# Patient Record
Sex: Female | Born: 1945 | Race: Black or African American | Hispanic: No | Marital: Married | Smoking: Former smoker
Health system: Southern US, Community
[De-identification: ages and names within clinical notes are randomized; demographics above are authoritative.]

## PROBLEM LIST (undated history)

## (undated) DIAGNOSIS — I10 Essential (primary) hypertension: Secondary | ICD-10-CM

## (undated) DIAGNOSIS — E119 Type 2 diabetes mellitus without complications: Secondary | ICD-10-CM

---

## 2016-01-06 ENCOUNTER — Emergency Department (HOSPITAL_COMMUNITY): Payer: Medicare Other

## 2016-01-06 ENCOUNTER — Encounter (HOSPITAL_COMMUNITY): Payer: Self-pay | Admitting: Emergency Medicine

## 2016-01-06 ENCOUNTER — Emergency Department (HOSPITAL_COMMUNITY)
Admission: EM | Admit: 2016-01-06 | Discharge: 2016-01-06 | Disposition: A | Discharge status: Home / Self Care | Payer: Medicare Other | Attending: Emergency Medicine | Admitting: Emergency Medicine

## 2016-01-06 DIAGNOSIS — K8020 Calculus of gallbladder without cholecystitis without obstruction: Secondary | ICD-10-CM | POA: Diagnosis not present

## 2016-01-06 DIAGNOSIS — E119 Type 2 diabetes mellitus without complications: Secondary | ICD-10-CM | POA: Insufficient documentation

## 2016-01-06 DIAGNOSIS — I10 Essential (primary) hypertension: Secondary | ICD-10-CM | POA: Insufficient documentation

## 2016-01-06 DIAGNOSIS — Z5321 Procedure and treatment not carried out due to patient leaving prior to being seen by health care provider: Secondary | ICD-10-CM | POA: Diagnosis not present

## 2016-01-06 DIAGNOSIS — Z79899 Other long term (current) drug therapy: Secondary | ICD-10-CM | POA: Insufficient documentation

## 2016-01-06 DIAGNOSIS — R1110 Vomiting, unspecified: Secondary | ICD-10-CM | POA: Diagnosis present

## 2016-01-06 HISTORY — DX: Type 2 diabetes mellitus without complications: E11.9

## 2016-01-06 HISTORY — DX: Essential (primary) hypertension: I10

## 2016-01-06 LAB — I-STAT CHEM 8, ED
BUN: 33 mg/dL — ABNORMAL HIGH (ref 6–20)
CALCIUM ION: 1.14 mmol/L (ref 1.13–1.30)
CHLORIDE: 104 mmol/L (ref 101–111)
Creatinine, Ser: 1.4 mg/dL — ABNORMAL HIGH (ref 0.44–1.00)
Glucose, Bld: 120 mg/dL — ABNORMAL HIGH (ref 65–99)
HEMATOCRIT: 51 % — AB (ref 36.0–46.0)
Hemoglobin: 17.3 g/dL — ABNORMAL HIGH (ref 12.0–15.0)
Potassium: 3.2 mmol/L — ABNORMAL LOW (ref 3.5–5.1)
SODIUM: 145 mmol/L (ref 135–145)
TCO2: 28 mmol/L (ref 0–100)

## 2016-01-06 LAB — COMPREHENSIVE METABOLIC PANEL
ALT: 18 U/L (ref 14–54)
ANION GAP: 9 (ref 5–15)
AST: 25 U/L (ref 15–41)
Albumin: 4.4 g/dL (ref 3.5–5.0)
Alkaline Phosphatase: 82 U/L (ref 38–126)
BILIRUBIN TOTAL: 0.5 mg/dL (ref 0.3–1.2)
BUN: 28 mg/dL — AB (ref 6–20)
CHLORIDE: 103 mmol/L (ref 101–111)
CO2: 27 mmol/L (ref 22–32)
Calcium: 9.4 mg/dL (ref 8.9–10.3)
Creatinine, Ser: 1.44 mg/dL — ABNORMAL HIGH (ref 0.44–1.00)
GFR, EST AFRICAN AMERICAN: 42 mL/min — AB (ref >60)
GFR, EST NON AFRICAN AMERICAN: 36 mL/min — AB (ref >60)
Glucose, Bld: 120 mg/dL — ABNORMAL HIGH (ref 65–99)
POTASSIUM: 3.1 mmol/L — AB (ref 3.5–5.1)
Sodium: 139 mmol/L (ref 135–145)
TOTAL PROTEIN: 8.4 g/dL — AB (ref 6.5–8.1)

## 2016-01-06 LAB — URINALYSIS, ROUTINE W REFLEX MICROSCOPIC
Bilirubin Urine: NEGATIVE
Glucose, UA: >1000 mg/dL — AB
Ketones, ur: NEGATIVE mg/dL
NITRITE: NEGATIVE
PROTEIN: NEGATIVE mg/dL
SPECIFIC GRAVITY, URINE: 1.01 (ref 1.005–1.030)
pH: 7 (ref 5.0–8.0)

## 2016-01-06 LAB — CBC WITH DIFFERENTIAL/PLATELET
BASOS ABS: 0 10*3/uL (ref 0.0–0.1)
Basophils Relative: 0 %
EOS PCT: 2 %
Eosinophils Absolute: 0.2 10*3/uL (ref 0.0–0.7)
HEMATOCRIT: 44.1 % (ref 36.0–46.0)
Hemoglobin: 14.9 g/dL (ref 12.0–15.0)
LYMPHS PCT: 38 %
Lymphs Abs: 3.6 10*3/uL (ref 0.7–4.0)
MCH: 31.8 pg (ref 26.0–34.0)
MCHC: 33.8 g/dL (ref 30.0–36.0)
MCV: 94.2 fL (ref 78.0–100.0)
MONO ABS: 0.6 10*3/uL (ref 0.1–1.0)
MONOS PCT: 6 %
NEUTROS ABS: 5.1 10*3/uL (ref 1.7–7.7)
Neutrophils Relative %: 54 %
PLATELETS: 201 10*3/uL (ref 150–400)
RBC: 4.68 MIL/uL (ref 3.87–5.11)
RDW: 13.3 % (ref 11.5–15.5)
WBC: 9.5 10*3/uL (ref 4.0–10.5)

## 2016-01-06 LAB — URINE MICROSCOPIC-ADD ON

## 2016-01-06 LAB — LIPASE, BLOOD: LIPASE: 40 U/L (ref 11–51)

## 2016-01-06 LAB — I-STAT TROPONIN, ED: TROPONIN I, POC: 0 ng/mL (ref 0.00–0.08)

## 2016-01-06 MED ORDER — ONDANSETRON HCL 4 MG/2ML IJ SOLN
4.0000 mg | Freq: Once | INTRAMUSCULAR | Status: AC
  Administered 2016-01-06: 4 mg via INTRAVENOUS
  Filled 2016-01-06: qty 2

## 2016-01-06 MED ORDER — FENTANYL CITRATE (PF) 100 MCG/2ML IJ SOLN
50.0000 ug | Freq: Once | INTRAMUSCULAR | Status: AC
  Administered 2016-01-06: 50 ug via INTRAVENOUS
  Filled 2016-01-06: qty 2

## 2016-01-06 MED ORDER — FENTANYL CITRATE (PF) 100 MCG/2ML IJ SOLN
25.0000 ug | Freq: Once | INTRAMUSCULAR | Status: AC
  Administered 2016-01-06: 25 ug via INTRAVENOUS
  Filled 2016-01-06: qty 2

## 2016-01-06 MED ORDER — ONDANSETRON HCL 8 MG PO TABS: 8.0000 mg | ORAL_TABLET | ORAL | Status: AC | PRN

## 2016-01-06 MED ORDER — OXYCODONE-ACETAMINOPHEN 5-325 MG PO TABS: 1.0000 | ORAL_TABLET | ORAL | Status: DC | PRN

## 2016-01-06 NOTE — ED Notes (Signed)
Pt states understanding of care given and follow up instructions.  Provided pt with copies of CT and labs because pts Dr is in [place].  Pt ambulated from ED with family.

## 2016-01-06 NOTE — ED Provider Notes (Signed)
CT scan reviewed with the patient and her daughter. Patient has a 12 mm stone in the gallbladder, but no evidence of cholecystitis. White count is normal. She will get an ultrasound in her hometown [place]. Prescriptions for pain and nausea medicine. No acute abdomen at discharge  Angel HutchingBrian Arta Stump, MD 01/06/16 2307

## 2016-01-06 NOTE — Discharge Instructions (Signed)
Cholelithiasis Cholelithiasis (also called gallstones) is a form of gallbladder disease in which gallstones form in your gallbladder. The gallbladder is an organ that stores bile made in the liver, which helps digest fats. Gallstones begin as small crystals and slowly grow into stones. Gallstone pain occurs when the gallbladder spasms and a gallstone is blocking the duct. Pain can also occur when a stone passes out of the duct.  RISK FACTORS  Being female.   Having multiple pregnancies. Health care providers sometimes advise removing diseased gallbladders before future pregnancies.   Being obese.  Eating a diet heavy in fried foods and fat.   Being older than 60 years and increasing age.   Prolonged use of medicines containing female hormones.   Having diabetes mellitus.   Rapidly losing weight.   Having a family history of gallstones (heredity).  SYMPTOMS  Nausea.   Vomiting.  Abdominal pain.   Yellowing of the skin (jaundice).   Sudden pain. It may persist from several minutes to several hours.  Fever.   Tenderness to the touch. In some cases, when gallstones do not move into the bile duct, people have no pain or symptoms. These are called "silent" gallstones.  TREATMENT Silent gallstones do not need treatment. In severe cases, emergency surgery may be required. Options for treatment include:  Surgery to remove the gallbladder. This is the most common treatment.  Medicines. These do not always work and may take 6-12 months or more to work.  Shock wave treatment (extracorporeal biliary lithotripsy). In this treatment an ultrasound machine sends shock waves to the gallbladder to break gallstones into smaller pieces that can pass into the intestines or be dissolved by medicine. HOME CARE INSTRUCTIONS   Only take over-the-counter or prescription medicines for pain, discomfort, or fever as directed by your health care provider.   Follow a low-fat diet until  seen again by your health care provider. Fat causes the gallbladder to contract, which can result in pain.   Follow up with your health care provider as directed. Attacks are almost always recurrent and surgery is usually required for permanent treatment.  SEEK IMMEDIATE MEDICAL CARE IF:   Your pain increases and is not controlled by medicines.   You have a fever or persistent symptoms for more than 2-3 days.   You have a fever and your symptoms suddenly get worse.   You have persistent nausea and vomiting.  MAKE SURE YOU:   Understand these instructions.  Will watch your condition.  Will get help right away if you are not doing well or get worse.   This information is not intended to replace advice given to you by your health care provider. Make sure you discuss any questions you have with your health care provider.   Document Released: 07/31/2005 Document Revised: 04/06/2013 Document Reviewed: 01/26/2013 Elsevier Interactive Patient Education [employer]2016 Elsevier Inc.   You have a large gallstone in your gallbladder. You will need an ultrasound. This can be done by your doctor back home. Medication for pain and nausea. Clear liquids for the next day. Take copies of test results with you.

## 2016-01-06 NOTE — ED Notes (Signed)
Pt states she had sudden onset of low back pain, central in nature, with nausea and vomiting.  Began about an hour pta.

## 2016-01-10 ENCOUNTER — Inpatient Hospital Stay (HOSPITAL_COMMUNITY)
Admission: AD | Admit: 2016-01-10 | Discharge: 2016-01-19 | DRG: 418 | Disposition: A | Discharge status: Home / Self Care | Payer: Medicare Other | Source: Ambulatory Visit | Attending: Internal Medicine | Admitting: Internal Medicine

## 2016-01-10 ENCOUNTER — Encounter (HOSPITAL_COMMUNITY): Payer: Self-pay | Admitting: Family Medicine

## 2016-01-10 ENCOUNTER — Other Ambulatory Visit: Payer: Self-pay

## 2016-01-10 DIAGNOSIS — N179 Acute kidney failure, unspecified: Secondary | ICD-10-CM | POA: Diagnosis present

## 2016-01-10 DIAGNOSIS — E669 Obesity, unspecified: Secondary | ICD-10-CM | POA: Diagnosis present

## 2016-01-10 DIAGNOSIS — J90 Pleural effusion, not elsewhere classified: Secondary | ICD-10-CM | POA: Diagnosis not present

## 2016-01-10 DIAGNOSIS — J9811 Atelectasis: Secondary | ICD-10-CM | POA: Diagnosis not present

## 2016-01-10 DIAGNOSIS — M545 Low back pain: Secondary | ICD-10-CM | POA: Diagnosis not present

## 2016-01-10 DIAGNOSIS — K8000 Calculus of gallbladder with acute cholecystitis without obstruction: Principal | ICD-10-CM | POA: Diagnosis present

## 2016-01-10 DIAGNOSIS — R188 Other ascites: Secondary | ICD-10-CM | POA: Diagnosis present

## 2016-01-10 DIAGNOSIS — K810 Acute cholecystitis: Secondary | ICD-10-CM | POA: Diagnosis present

## 2016-01-10 DIAGNOSIS — E785 Hyperlipidemia, unspecified: Secondary | ICD-10-CM | POA: Diagnosis present

## 2016-01-10 DIAGNOSIS — R509 Fever, unspecified: Secondary | ICD-10-CM

## 2016-01-10 DIAGNOSIS — K8050 Calculus of bile duct without cholangitis or cholecystitis without obstruction: Secondary | ICD-10-CM | POA: Diagnosis present

## 2016-01-10 DIAGNOSIS — K8020 Calculus of gallbladder without cholecystitis without obstruction: Secondary | ICD-10-CM | POA: Insufficient documentation

## 2016-01-10 DIAGNOSIS — G8929 Other chronic pain: Secondary | ICD-10-CM | POA: Diagnosis present

## 2016-01-10 DIAGNOSIS — I248 Other forms of acute ischemic heart disease: Secondary | ICD-10-CM | POA: Diagnosis present

## 2016-01-10 DIAGNOSIS — Z87891 Personal history of nicotine dependence: Secondary | ICD-10-CM

## 2016-01-10 DIAGNOSIS — K8010 Calculus of gallbladder with chronic cholecystitis without obstruction: Secondary | ICD-10-CM | POA: Diagnosis not present

## 2016-01-10 DIAGNOSIS — E119 Type 2 diabetes mellitus without complications: Secondary | ICD-10-CM | POA: Diagnosis present

## 2016-01-10 DIAGNOSIS — K829 Disease of gallbladder, unspecified: Secondary | ICD-10-CM

## 2016-01-10 DIAGNOSIS — E876 Hypokalemia: Secondary | ICD-10-CM | POA: Diagnosis present

## 2016-01-10 DIAGNOSIS — Z79899 Other long term (current) drug therapy: Secondary | ICD-10-CM

## 2016-01-10 DIAGNOSIS — I10 Essential (primary) hypertension: Secondary | ICD-10-CM | POA: Diagnosis present

## 2016-01-10 DIAGNOSIS — B9620 Unspecified Escherichia coli [E. coli] as the cause of diseases classified elsewhere: Secondary | ICD-10-CM | POA: Diagnosis present

## 2016-01-10 DIAGNOSIS — Z880 Allergy status to penicillin: Secondary | ICD-10-CM

## 2016-01-10 DIAGNOSIS — Z7984 Long term (current) use of oral hypoglycemic drugs: Secondary | ICD-10-CM

## 2016-01-10 DIAGNOSIS — Z6829 Body mass index (BMI) 29.0-29.9, adult: Secondary | ICD-10-CM

## 2016-01-10 LAB — CBC WITH DIFFERENTIAL/PLATELET
BASOS PCT: 0 %
Basophils Absolute: 0 10*3/uL (ref 0.0–0.1)
EOS ABS: 0 10*3/uL (ref 0.0–0.7)
EOS PCT: 0 %
HCT: 38.7 % (ref 36.0–46.0)
HEMOGLOBIN: 12.9 g/dL (ref 12.0–15.0)
Lymphocytes Relative: 12 %
Lymphs Abs: 1.9 10*3/uL (ref 0.7–4.0)
MCH: 30.9 pg (ref 26.0–34.0)
MCHC: 33.3 g/dL (ref 30.0–36.0)
MCV: 92.6 fL (ref 78.0–100.0)
Monocytes Absolute: 2.1 10*3/uL — ABNORMAL HIGH (ref 0.1–1.0)
Monocytes Relative: 13 %
NEUTROS PCT: 75 %
Neutro Abs: 11.7 10*3/uL — ABNORMAL HIGH (ref 1.7–7.7)
PLATELETS: 186 10*3/uL (ref 150–400)
RBC: 4.18 MIL/uL (ref 3.87–5.11)
RDW: 13 % (ref 11.5–15.5)
WBC: 15.7 10*3/uL — AB (ref 4.0–10.5)

## 2016-01-10 LAB — PROCALCITONIN: PROCALCITONIN: 3.58 ng/mL

## 2016-01-10 LAB — LACTIC ACID, PLASMA: LACTIC ACID, VENOUS: 1.1 mmol/L (ref 0.5–2.0)

## 2016-01-10 LAB — COMPREHENSIVE METABOLIC PANEL
ALK PHOS: 86 U/L (ref 38–126)
ALT: 27 U/L (ref 14–54)
ANION GAP: 8 (ref 5–15)
AST: 33 U/L (ref 15–41)
Albumin: 2.6 g/dL — ABNORMAL LOW (ref 3.5–5.0)
BILIRUBIN TOTAL: 0.7 mg/dL (ref 0.3–1.2)
BUN: 39 mg/dL — ABNORMAL HIGH (ref 6–20)
CALCIUM: 8.7 mg/dL — AB (ref 8.9–10.3)
CO2: 28 mmol/L (ref 22–32)
Chloride: 98 mmol/L — ABNORMAL LOW (ref 101–111)
Creatinine, Ser: 2.3 mg/dL — ABNORMAL HIGH (ref 0.44–1.00)
GFR, EST AFRICAN AMERICAN: 24 mL/min — AB (ref >60)
GFR, EST NON AFRICAN AMERICAN: 20 mL/min — AB (ref >60)
Glucose, Bld: 130 mg/dL — ABNORMAL HIGH (ref 65–99)
Potassium: 2.8 mmol/L — ABNORMAL LOW (ref 3.5–5.1)
Sodium: 134 mmol/L — ABNORMAL LOW (ref 135–145)
TOTAL PROTEIN: 6.8 g/dL (ref 6.5–8.1)

## 2016-01-10 LAB — AMYLASE: Amylase: 46 U/L (ref 28–100)

## 2016-01-10 LAB — PROTIME-INR
INR: 1.22 (ref 0.00–1.49)
PROTHROMBIN TIME: 15.6 s — AB (ref 11.6–15.2)

## 2016-01-10 LAB — GLUCOSE, CAPILLARY: GLUCOSE-CAPILLARY: 102 mg/dL — AB (ref 65–99)

## 2016-01-10 LAB — LIPASE, BLOOD: Lipase: 20 U/L (ref 11–51)

## 2016-01-10 LAB — MAGNESIUM: Magnesium: 2.1 mg/dL (ref 1.7–2.4)

## 2016-01-10 LAB — APTT: APTT: 31 s (ref 24–37)

## 2016-01-10 LAB — TROPONIN I: Troponin I: 0.11 ng/mL — ABNORMAL HIGH (ref <0.031)

## 2016-01-10 MED ORDER — BISACODYL 5 MG PO TBEC: 5.0000 mg | DELAYED_RELEASE_TABLET | Freq: Every day | ORAL | Status: DC | PRN

## 2016-01-10 MED ORDER — SODIUM CHLORIDE 0.9% FLUSH: 3.0000 mL | INTRAVENOUS | Status: DC | PRN

## 2016-01-10 MED ORDER — HYDROMORPHONE HCL 1 MG/ML IJ SOLN
1.0000 mg | INTRAMUSCULAR | Status: DC | PRN
  Administered 2016-01-12 – 2016-01-13 (×4): 1 mg via INTRAVENOUS
  Filled 2016-01-10 (×5): qty 1

## 2016-01-10 MED ORDER — ACETAMINOPHEN 325 MG PO TABS: 650.0000 mg | ORAL_TABLET | Freq: Four times a day (QID) | ORAL | Status: DC | PRN

## 2016-01-10 MED ORDER — HEPARIN SODIUM (PORCINE) 5000 UNIT/ML IJ SOLN
5000.0000 [IU] | Freq: Three times a day (TID) | INTRAMUSCULAR | Status: DC
  Administered 2016-01-11 (×2): 5000 [IU] via SUBCUTANEOUS
  Filled 2016-01-10 (×3): qty 1

## 2016-01-10 MED ORDER — PRAVASTATIN SODIUM 10 MG PO TABS
20.0000 mg | ORAL_TABLET | Freq: Every day | ORAL | Status: DC
  Administered 2016-01-11 – 2016-01-18 (×8): 20 mg via ORAL
  Filled 2016-01-10 (×8): qty 2

## 2016-01-10 MED ORDER — DEXTROSE 5 % IV SOLN
2.0000 g | INTRAVENOUS | Status: DC
  Administered 2016-01-11 – 2016-01-13 (×4): 2 g via INTRAVENOUS
  Filled 2016-01-10 (×4): qty 2

## 2016-01-10 MED ORDER — INSULIN ASPART 100 UNIT/ML ~~LOC~~ SOLN
0.0000 [IU] | Freq: Three times a day (TID) | SUBCUTANEOUS | Status: DC
  Administered 2016-01-12: 2 [IU] via SUBCUTANEOUS
  Administered 2016-01-13: 1 [IU] via SUBCUTANEOUS
  Administered 2016-01-14: 2 [IU] via SUBCUTANEOUS
  Administered 2016-01-15 – 2016-01-16 (×2): 1 [IU] via SUBCUTANEOUS
  Administered 2016-01-16 – 2016-01-18 (×2): 2 [IU] via SUBCUTANEOUS

## 2016-01-10 MED ORDER — ONDANSETRON HCL 4 MG PO TABS: 4.0000 mg | ORAL_TABLET | Freq: Four times a day (QID) | ORAL | Status: DC | PRN

## 2016-01-10 MED ORDER — ONDANSETRON HCL 4 MG/2ML IJ SOLN
4.0000 mg | Freq: Four times a day (QID) | INTRAMUSCULAR | Status: DC | PRN
  Administered 2016-01-12 – 2016-01-17 (×2): 4 mg via INTRAVENOUS
  Filled 2016-01-10: qty 2

## 2016-01-10 MED ORDER — SODIUM CHLORIDE 0.9 % IV SOLN: 250.0000 mL | INTRAVENOUS | Status: DC | PRN

## 2016-01-10 MED ORDER — SODIUM CHLORIDE 0.9% FLUSH: 3.0000 mL | Freq: Two times a day (BID) | INTRAVENOUS | Status: DC

## 2016-01-10 MED ORDER — POLYETHYLENE GLYCOL 3350 17 G PO PACK: 17.0000 g | PACK | Freq: Every day | ORAL | Status: DC | PRN

## 2016-01-10 MED ORDER — ACETAMINOPHEN 650 MG RE SUPP: 650.0000 mg | Freq: Four times a day (QID) | RECTAL | Status: DC | PRN

## 2016-01-10 MED ORDER — HYDROCODONE-ACETAMINOPHEN 5-325 MG PO TABS
1.0000 | ORAL_TABLET | ORAL | Status: DC | PRN
  Administered 2016-01-13: 1 via ORAL
  Administered 2016-01-13 – 2016-01-15 (×4): 2 via ORAL
  Administered 2016-01-16: 1 via ORAL
  Filled 2016-01-10: qty 2
  Filled 2016-01-10: qty 1
  Filled 2016-01-10 (×3): qty 2
  Filled 2016-01-10: qty 1

## 2016-01-10 NOTE — Progress Notes (Signed)
Pharmacy Antibiotic Note  Angel Gonzalez is a 70 y.o. female admitted on 01/10/2016 with acute chole.  Pharmacy has been consulted for ceftriaxone dosing. Pt presents with sudden onset lower back pain and N/V. Pt has allergy to PCNs in the past with hives but no swelling of throat or tongue or SOB. Pt is willing to try cephalosporins.   Plan: Ceftriaxone 2g IV q24h Monitor culture data, clinical course and LOT  Height: 5\' 3"  (160 cm) Weight: 164 lb (74.39 kg) IBW/kg (Calculated) : 52.4  Temp (24hrs), Avg:99.3 F (37.4 C), Min:98.6 F (37 C), Max:99.9 F (37.7 C)   Recent Labs Lab 01/06/16 1815 01/06/16 1854 01/10/16 2219  WBC 9.5  --  15.7*  CREATININE 1.44* 1.40*  --   LATICACIDVEN  --   --  1.1    Estimated Creatinine Clearance: 36.1 mL/min (by C-G formula based on Cr of 1.4).    Allergies  Allergen Reactions  . Penicillins Hives    Antimicrobials this admission: CTX 5/25 >>   Dose adjustments this admission: n/a  Microbiology results:  BCx:   UCx:    Sputum:    MRSA PCR:    Arlean Hoppingorey M. Newman PiesBall, PharmD, BCPS Clinical Pharmacist Pager [phone] 01/10/2016 11:10 PM

## 2016-01-10 NOTE — H&P (Signed)
History and Physical    Angel Gonzalez WUJ:[id number] DOB: 1946-05-11 DOA: 01/10/2016  [facility]   Patient coming from: Home   Chief Complaint: RUQ abdominal pain   HPI: Angel Gonzalez is a 70 y.o. female with medical history significant for hypertension, hyperlipidemia, type 2 diabetes mellitus, and chronic low back pain who presents as a direct admission, sent by Judee ClaraJulie Johnson, NP for evaluation of biliary colic. Patient reports being in her usual state of health until 01/06/2016 when there was insidious development of a dull ache in the right upper quadrant. This pain progressed over the course of approximately 30 minutes and became accompanied by nausea and vomiting before spontaneously resolving. Since that time, she's had recurrences in this pain with increasing frequency.She now reports a constant, dull ache at the right upper quadrant that is tolerable while at rest, but worse while moving around. She denies noticing any change in her symptoms with oral intake but notes that she has not been up to hold much down due to the nausea and vomiting. She was evaluated for these complaints in the [facility] emergency department on 01/06/2016 with CT scan that demonstrated a 12 mm stone at the gallbladder neck, but per the provider notes, there was no evidence of acute cholecystitis and she was discharged home to follow-up with her primary care provider for abdominal [place]. [name], [name], being told she would need cholecystectomy, and was advised to present to [facility] for direct admission.   Patient is [facility] where she is found to be afebrile, saturating well on room air, and with vital signs stable. She denies any pain currently, noting that she had some earlier this morning. She is in no apparent distress, nontoxic in appearance.  She denies any pain currently, denies any recent chest pain, palpitations, dyspnea, or cough. There is been non-bloody vomiting, but no diarrhea. She denies any urinary symptoms and denies recent antibiotic use. She'll be placed in observation status for further evaluation and management of suspected biliary colic.  Review of Systems:  All other systems reviewed and apart from HPI, are negative.  Past Medical History  Diagnosis Date  . Hypertension   . Diabetes mellitus without complication (HCC)     History reviewed. No pertinent past surgical history.   reports that she has never smoked. She does not have any smokeless tobacco history on file. She reports that she does not drink alcohol or use illicit drugs.  Allergies  Allergen Reactions  . Penicillins Hives    Family History  Problem Relation Age of Onset  . Diabetes type II Other   . Hypertension Other      Prior to Admission medications   Medication Sig Start Date End Date Taking? Authorizing Provider  allopurinol (ZYLOPRIM) 300 MG tablet Take 300 mg by mouth daily.   Yes Historical Provider, MD  amLODipine (NORVASC) 5 MG tablet Take 5 mg by mouth daily.   Yes Historical Provider, MD  canagliflozin (INVOKANA) 100 MG TABS tablet Take 100 mg by mouth daily before breakfast.   Yes Historical Provider, MD  cholecalciferol (VITAMIN D) 1000 units tablet Take 1,000 Units by mouth daily.   Yes Historical Provider, MD  losartan (COZAAR) 100 MG tablet Take 100 mg by mouth daily.   Yes Historical Provider, MD  metoprolol succinate (TOPROL-XL) 25 MG 24 hr tablet  Take 25 mg by mouth daily.   Yes Historical Provider, MD  ondansetron (ZOFRAN) 8 MG tablet Take 1 tablet (8 mg total) by mouth every 4 (four) hours as needed. 01/06/16  Yes Donnetta Hutching, MD  oxyCODONE-acetaminophen (PERCOCET) 5-325 MG tablet Take 1-2 tablets by mouth every 4 (four) hours as needed. 01/06/16  Yes Donnetta Hutching, MD  pravastatin (PRAVACHOL) 20 MG tablet Take 20 mg by  mouth daily.   Yes Historical Provider, MD  FIBER SELECT GUMMIES PO Take 1 tablet by mouth daily as needed (Constipation).    Historical Provider, MD    Physical Exam: Filed Vitals:   01/10/16 1905 01/10/16 2145  BP: 105/58 104/66  Pulse: 97 85  Temp: 99.9 F (37.7 C) 98.6 F (37 C)  TempSrc: Oral Oral  Resp:  18  Height: 5\' 3"  (1.6 m)   Weight: 74.39 kg (164 lb)   SpO2: 99% 95%      Constitutional: NAD, calm, comfortable Eyes: PERTLA, lids and conjunctivae normal ENMT: Mucous membranes are moist. Posterior pharynx clear of any exudate or lesions.   Neck: normal, supple, no masses, no thyromegaly Respiratory: clear to auscultation bilaterally, no wheezing, no crackles. Normal respiratory effort. No accessory muscle use.  Cardiovascular: S1 & S2 heard, regular rate and rhythm, no significant murmurs / rubs / gallops. No significant JVD. Abdomen: No distension, mild tenderness at RUQ, no rebound pain or guarding, no masses palpated. Bowel sounds normal.  Musculoskeletal: no clubbing / cyanosis. No joint deformity upper and lower extremities. Normal muscle tone.  Skin: no significant rashes, lesions, ulcers. Warm, dry, well-perfused. Neurologic: CN 2-12 grossly intact. Sensation intact, DTR normal. Strength 5/5 in all 4 limbs.  Psychiatric: Normal judgment and insight. Alert and oriented x 3. Normal mood and affect.     Labs on Admission: I have personally reviewed following labs and imaging studies  CBC:  Recent Labs Lab 01/06/16 1815 01/06/16 1854  WBC 9.5  --   NEUTROABS 5.1  --   HGB 14.9 17.3*  HCT 44.1 51.0*  MCV 94.2  --   PLT 201  --    Basic Metabolic Panel:  Recent Labs Lab 01/06/16 1815 01/06/16 1854  NA 139 145  K 3.1* 3.2*  CL 103 104  CO2 27  --   GLUCOSE 120* 120*  BUN 28* 33*  CREATININE 1.44* 1.40*  CALCIUM 9.4  --    GFR: Estimated Creatinine Clearance: 36.1 mL/min (by C-G formula based on Cr of 1.4). Liver Function Tests:  Recent  Labs Lab 01/06/16 1815  AST 25  ALT 18  ALKPHOS 82  BILITOT 0.5  PROT 8.4*  ALBUMIN 4.4    Recent Labs Lab 01/06/16 1815  LIPASE 40   No results for input(s): AMMONIA in the last 168 hours. Coagulation Profile: No results for input(s): INR, PROTIME in the last 168 hours. Cardiac Enzymes: No results for input(s): CKTOTAL, CKMB, CKMBINDEX, TROPONINI in the last 168 hours. BNP (last 3 results) No results for input(s): PROBNP in the last 8760 hours. HbA1C: No results for input(s): HGBA1C in the last 72 hours. CBG: No results for input(s): GLUCAP in the last 168 hours. Lipid Profile: No results for input(s): CHOL, HDL, LDLCALC, TRIG, CHOLHDL, LDLDIRECT in the last 72 hours. Thyroid Function Tests: No results for input(s): TSH, T4TOTAL, FREET4, T3FREE, THYROIDAB in the last 72 hours. Anemia Panel: No results for input(s): VITAMINB12, FOLATE, FERRITIN, TIBC, IRON, RETICCTPCT in the last 72 hours. Urine analysis:    Component Value  Date/Time   COLORURINE YELLOW 01/06/2016 1908   APPEARANCEUR CLEAR 01/06/2016 1908   LABSPEC 1.010 01/06/2016 1908   PHURINE 7.0 01/06/2016 1908   GLUCOSEU >1000* 01/06/2016 1908   HGBUR TRACE* 01/06/2016 1908   BILIRUBINUR NEGATIVE 01/06/2016 1908   KETONESUR NEGATIVE 01/06/2016 1908   PROTEINUR NEGATIVE 01/06/2016 1908   NITRITE NEGATIVE 01/06/2016 1908   LEUKOCYTESUR TRACE* 01/06/2016 1908   Sepsis Labs: (procalcitonin:4,lacticidven:4) )No results found for this or any previous visit (from the past 240 hour(s)).   Radiological Exams on Admission: No results found.  EKG: Ordered and pending.   Assessment/Plan  1. Biliary colic  - CT from 5/21 reviewed and notable for a 12 mm stone at the gallbladder neck; there was no evidence for acute cholecystitis at that time  - Pt remains afebrile, non-toxic in appearance  - Abdominal US ordered for further characterization  - Check CMP, CBC w/ differential, lipase, lactate, PCT  -  Clear liquids for now, NPO after MN pending the above workup  - Surgical consultation may be requested pending the above workup   2. Hypertension  - At goal currently, but on the low side  - Managed with losartan, Norvasc, and metoprolol at home  - Holding the home agents for now; plan to resume Norvasc and metoprolol as needed; would hold losartan if surgery may be necessary    3. Type II DM  - No A1c on file  - Managed with Invokana at home, holding while in hospital  - Check CBG with meals and qHS  - Low-intensity SSI prn    4. Hyperlipidemia  - Managed with pravastatin 20 mg qHS at home, will continue    5. 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

## 2016-01-11 ENCOUNTER — Observation Stay (HOSPITAL_BASED_OUTPATIENT_CLINIC_OR_DEPARTMENT_OTHER): Payer: Medicare Other

## 2016-01-11 ENCOUNTER — Observation Stay (HOSPITAL_COMMUNITY): Payer: Medicare Other

## 2016-01-11 ENCOUNTER — Encounter (HOSPITAL_COMMUNITY): Payer: Self-pay | Admitting: General Surgery

## 2016-01-11 DIAGNOSIS — R7989 Other specified abnormal findings of blood chemistry: Secondary | ICD-10-CM | POA: Diagnosis not present

## 2016-01-11 DIAGNOSIS — E876 Hypokalemia: Secondary | ICD-10-CM | POA: Diagnosis present

## 2016-01-11 DIAGNOSIS — K8050 Calculus of bile duct without cholangitis or cholecystitis without obstruction: Secondary | ICD-10-CM | POA: Diagnosis not present

## 2016-01-11 DIAGNOSIS — I10 Essential (primary) hypertension: Secondary | ICD-10-CM | POA: Diagnosis not present

## 2016-01-11 DIAGNOSIS — N179 Acute kidney failure, unspecified: Secondary | ICD-10-CM | POA: Diagnosis present

## 2016-01-11 DIAGNOSIS — K8000 Calculus of gallbladder with acute cholecystitis without obstruction: Principal | ICD-10-CM

## 2016-01-11 LAB — SURGICAL PCR SCREEN
MRSA, PCR: NEGATIVE
STAPHYLOCOCCUS AUREUS: NEGATIVE

## 2016-01-11 LAB — URINALYSIS, ROUTINE W REFLEX MICROSCOPIC
Bilirubin Urine: NEGATIVE
Hgb urine dipstick: NEGATIVE
Ketones, ur: NEGATIVE mg/dL
LEUKOCYTES UA: NEGATIVE
Nitrite: NEGATIVE
PROTEIN: NEGATIVE mg/dL
Specific Gravity, Urine: 1.026 (ref 1.005–1.030)
pH: 5.5 (ref 5.0–8.0)

## 2016-01-11 LAB — BASIC METABOLIC PANEL
Anion gap: 10 (ref 5–15)
BUN: 32 mg/dL — AB (ref 6–20)
CALCIUM: 8.7 mg/dL — AB (ref 8.9–10.3)
CO2: 25 mmol/L (ref 22–32)
CREATININE: 1.98 mg/dL — AB (ref 0.44–1.00)
Chloride: 102 mmol/L (ref 101–111)
GFR, EST AFRICAN AMERICAN: 28 mL/min — AB (ref >60)
GFR, EST NON AFRICAN AMERICAN: 24 mL/min — AB (ref >60)
Glucose, Bld: 134 mg/dL — ABNORMAL HIGH (ref 65–99)
Potassium: 3.6 mmol/L (ref 3.5–5.1)
SODIUM: 137 mmol/L (ref 135–145)

## 2016-01-11 LAB — ECHOCARDIOGRAM COMPLETE
Height: 63 in
WEIGHTICAEL: 2624 [oz_av]

## 2016-01-11 LAB — BLOOD CULTURE ID PANEL (REFLEXED)
Acinetobacter baumannii: NOT DETECTED
CANDIDA ALBICANS: NOT DETECTED
CANDIDA KRUSEI: NOT DETECTED
CANDIDA PARAPSILOSIS: NOT DETECTED
CANDIDA TROPICALIS: NOT DETECTED
CARBAPENEM RESISTANCE: NOT DETECTED
Candida glabrata: NOT DETECTED
ENTEROBACTERIACEAE SPECIES: DETECTED — AB
ENTEROCOCCUS SPECIES: NOT DETECTED
Enterobacter cloacae complex: NOT DETECTED
Escherichia coli: DETECTED — AB
Haemophilus influenzae: NOT DETECTED
KLEBSIELLA OXYTOCA: NOT DETECTED
KLEBSIELLA PNEUMONIAE: NOT DETECTED
Listeria monocytogenes: NOT DETECTED
Methicillin resistance: NOT DETECTED
Neisseria meningitidis: NOT DETECTED
PROTEUS SPECIES: NOT DETECTED
PSEUDOMONAS AERUGINOSA: NOT DETECTED
STAPHYLOCOCCUS SPECIES: NOT DETECTED
STREPTOCOCCUS AGALACTIAE: NOT DETECTED
STREPTOCOCCUS PNEUMONIAE: NOT DETECTED
Serratia marcescens: NOT DETECTED
Staphylococcus aureus (BCID): NOT DETECTED
Streptococcus pyogenes: NOT DETECTED
Streptococcus species: NOT DETECTED
Vancomycin resistance: NOT DETECTED

## 2016-01-11 LAB — URINE MICROSCOPIC-ADD ON
RBC / HPF: NONE SEEN RBC/hpf (ref 0–5)
WBC UA: NONE SEEN WBC/hpf (ref 0–5)

## 2016-01-11 LAB — GLUCOSE, CAPILLARY
GLUCOSE-CAPILLARY: 128 mg/dL — AB (ref 65–99)
GLUCOSE-CAPILLARY: 127 mg/dL — AB (ref 65–99)
Glucose-Capillary: 104 mg/dL — ABNORMAL HIGH (ref 65–99)
Glucose-Capillary: 106 mg/dL — ABNORMAL HIGH (ref 65–99)

## 2016-01-11 LAB — HEPATIC FUNCTION PANEL
ALT: 42 U/L (ref 14–54)
AST: 56 U/L — ABNORMAL HIGH (ref 15–41)
Albumin: 2.5 g/dL — ABNORMAL LOW (ref 3.5–5.0)
Alkaline Phosphatase: 103 U/L (ref 38–126)
BILIRUBIN DIRECT: 0.3 mg/dL (ref 0.1–0.5)
BILIRUBIN INDIRECT: 0.5 mg/dL (ref 0.3–0.9)
TOTAL PROTEIN: 6.8 g/dL (ref 6.5–8.1)
Total Bilirubin: 0.8 mg/dL (ref 0.3–1.2)

## 2016-01-11 LAB — LACTIC ACID, PLASMA: Lactic Acid, Venous: 0.9 mmol/L (ref 0.5–2.0)

## 2016-01-11 LAB — TROPONIN I
TROPONIN I: 0.06 ng/mL — AB (ref <0.031)
Troponin I: 0.03 ng/mL (ref <0.031)
Troponin I: 0.04 ng/mL — ABNORMAL HIGH (ref <0.031)

## 2016-01-11 MED ORDER — SODIUM CHLORIDE 0.9 % IV SOLN
INTRAVENOUS | Status: AC
  Administered 2016-01-11: 01:00:00 via INTRAVENOUS

## 2016-01-11 MED ORDER — ASPIRIN 81 MG PO CHEW
324.0000 mg | CHEWABLE_TABLET | Freq: Once | ORAL | Status: AC
  Administered 2016-01-11: 324 mg via ORAL
  Filled 2016-01-11: qty 4

## 2016-01-11 MED ORDER — POTASSIUM CHLORIDE 10 MEQ/100ML IV SOLN
10.0000 meq | INTRAVENOUS | Status: AC
  Administered 2016-01-11 (×4): 10 meq via INTRAVENOUS
  Filled 2016-01-11 (×4): qty 100

## 2016-01-11 MED ORDER — SODIUM CHLORIDE 0.9 % IV SOLN
INTRAVENOUS | Status: DC
  Administered 2016-01-11 – 2016-01-16 (×8): via INTRAVENOUS

## 2016-01-11 NOTE — Progress Notes (Addendum)
Patient ID: Angel Gonzalez, female   DOB: 05-Jan-1946, 70 y.o.   MRN: [id number]  PROGRESS NOTE    Angel Anadine L Whittinghill  WUJ:[id number] DOB: 05-Jan-1946 DOA: 01/10/2016  [facility]   Brief Narrative:  70 year-old female with past medical history significant for hypertension, dyslipidemia, diabetes. Patient was sent from primary care office for evaluation of biliary colic. Patient started having right upper quadrant abdominal pain 01/06/2016 which was associated with nausea and vomiting and then it spontaneously resolved after 30 minutes. Since then however she continued to have recurrence of this pain with increased frequency and duration. She was not able to tolerate any by mouth intake because of nausea and vomiting. She has been seen [facility] department 01/06/2016 were CT scan demonstrated 12 mm stone in gallbladder neck but there was no evidence of acute cholecystitis so she was subsequently discharged home. Because of ongoing pain and nausea and vomiting she presented to primary care office and was [facility].  Patient was hemodynamically stable on the admission. Blood work was notable for white blood cell count of 15.7, potassium 2.8, creatinine 2.30, normal lipase and normal LFTs. Her abdominal ultrasound significant for acute cholecystitis. Surgery will see the patient in consultation.   Assessment & Plan:   Principal Problem:   Biliary colic / Acute cholecystitis / leukocytosis - Normal liver function enzymes, normal lipase - Abdominal ultrasound significant for acute cholecystitis - Started on Rocephin at the time of the admission - Appreciate surgery consult and recommendations - Continue IV fluids, keep nothing by mouth - Continue pain management efforts - Continue antiemetics for nausea and/or vomiting as needed  Active Problems:   Mild troponin elevation  - Likely secondary to demand ischemia in the setting  of acute cholecystitis - Troponin 0.11, 0.03, 0.04 - No acute ischemic changes on admission 12-lead EKG - Chest pain-free - Obtain 2-D echo    Essential hypertension - Blood pressure 112/54, off of antihypertensive meds    Hyperlipidemia - Continue Pravachol    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 (HCC) - A1c is pending - Currently on sliding scale insulin    Hypokalemia - Likely due to GI losses - Supplemented and within normal limits    Acute kidney injury - Creatinine 2.3 on the admission and improving with hydration; on 5/21 Cr 1.44    DVT prophylaxis: Heparin subcutaneous Code Status: full code  Family Communication: Daughter at the bedside Disposition Plan: Anticipate discharge once acute cholecystitis etiology resolves   Consultants:   Surgery  Procedures:   None  Antimicrobials:   Rocephin 01/10/2016 -->   Subjective: Per patient, pain much better this morning.  Objective: Filed Vitals:   01/10/16 1905 01/10/16 2145 01/11/16 0501  BP: 105/58 104/66 112/54  Pulse: 97 85 60  Temp: 99.9 F (37.7 C) 98.6 F (37 C) 98.6 F (37 C)  TempSrc: Oral Oral Oral  Resp:  18 18  Height: 5\' 3"  (1.6 m)    Weight: 74.39 kg (164 lb)    SpO2: 99% 95% 95%    Intake/Output Summary (Last 24 hours) at 01/11/16 0944 Last data filed at 01/11/16 0600  Gross per 24 hour  Intake   1250 ml  Output    750 ml  Net    500 ml   Filed Weights   01/10/16 1905  Weight: 74.39 kg (164 lb)    Examination:  General exam: Appears  calm and comfortable  Respiratory system: Clear to auscultation. Respiratory effort normal. Cardiovascular system: S1 & S2 heard, Rate controlled  Gastrointestinal system: Abdomen is nondistended, tender on right side. No organomegaly or masses felt. Normal bowel sounds heard. Central nervous system: Alert and oriented. No focal neurological deficits. Extremities: Symmetric 5 x 5 power. Skin: No rashes,  lesions or ulcers Psychiatry: Judgement and insight appear normal. Mood & affect appropriate.   Data Reviewed: I have personally reviewed following labs and imaging studies  CBC:  Recent Labs Lab 01/06/16 1815 01/06/16 1854 01/10/16 2219  WBC 9.5  --  15.7*  NEUTROABS 5.1  --  11.7*  HGB 14.9 17.3* 12.9  HCT 44.1 51.0* 38.7  MCV 94.2  --  92.6  PLT 201  --  186   Basic Metabolic Panel:  Recent Labs Lab 01/06/16 1815 01/06/16 1854 01/10/16 2219 01/11/16 0827  NA 139 145 134* 137  K 3.1* 3.2* 2.8* 3.6  CL 103 104 98* 102  CO2 27  --  28 25  GLUCOSE 120* 120* 130* 134*  BUN 28* 33* 39* 32*  CREATININE 1.44* 1.40* 2.30* 1.98*  CALCIUM 9.4  --  8.7* 8.7*  MG  --   --  2.1  --    GFR: Estimated Creatinine Clearance: 25.5 mL/min (by C-G formula based on Cr of 1.98). Liver Function Tests:  Recent Labs Lab 01/06/16 1815 01/10/16 2219  AST 25 33  ALT 18 27  ALKPHOS 82 86  BILITOT 0.5 0.7  PROT 8.4* 6.8  ALBUMIN 4.4 2.6*    Recent Labs Lab 01/06/16 1815 01/10/16 2219  LIPASE 40 20  AMYLASE  --  46   No results for input(s): AMMONIA in the last 168 hours. Coagulation Profile:  Recent Labs Lab 01/10/16 2219  INR 1.22   Cardiac Enzymes:  Recent Labs Lab 01/10/16 2219 01/11/16 0030 01/11/16 0827  TROPONINI 0.11* 0.03 0.04*   BNP (last 3 results) No results for input(s): PROBNP in the last 8760 hours. HbA1C: No results for input(s): HGBA1C in the last 72 hours. CBG:  Recent Labs Lab 01/10/16 2303 01/11/16 0817  GLUCAP 102* 128*   Lipid Profile: No results for input(s): CHOL, HDL, LDLCALC, TRIG, CHOLHDL, LDLDIRECT in the last 72 hours. Thyroid Function Tests: No results for input(s): TSH, T4TOTAL, FREET4, T3FREE, THYROIDAB in the last 72 hours. Anemia Panel: No results for input(s): VITAMINB12, FOLATE, FERRITIN, TIBC, IRON, RETICCTPCT in the last 72 hours. Urine analysis:    Component Value Date/Time   COLORURINE YELLOW 01/10/2016 2353     APPEARANCEUR CLEAR 01/10/2016 2353   LABSPEC 1.026 01/10/2016 2353   PHURINE 5.5 01/10/2016 2353   GLUCOSEU >1000* 01/10/2016 2353   HGBUR NEGATIVE 01/10/2016 2353   BILIRUBINUR NEGATIVE 01/10/2016 2353   KETONESUR NEGATIVE 01/10/2016 2353   PROTEINUR NEGATIVE 01/10/2016 2353   NITRITE NEGATIVE 01/10/2016 2353   LEUKOCYTESUR NEGATIVE 01/10/2016 2353   Sepsis Labs: @LABRCNTIP (procalcitonin:4,lacticidven:4)   )No results found for this or any previous visit (from the past 240 hour(s)).    Radiology Studies: US Abdomen Complete 01/11/2016  1. Gallstones, gallbladder sludge and diffuse gallbladder wall thickening and pericholecystic fluid compatible with acute cholecystitis. 2. Hepatic steatosis suspected. 3. Left pleural effusion 4. Right perinephric fluid noted without hydronephrosis. Electronically Signed   By: Signa Kell M.D.   On: 01/11/2016 08:19    Scheduled Meds: . cefTRIAXone (ROCEPHIN)  IV  2 g Intravenous Q24H  . heparin  5,000 Units Subcutaneous Q8H  . insulin  aspart  0-9 Units Subcutaneous TID WC  . pravastatin  20 mg Oral q1800  . sodium chloride flush  3 mL Intravenous Q12H   Continuous Infusions: . sodium chloride 100 mL/hr at 01/11/16 0046     LOS: 1 day    Time spent: 25 minutes  Greater than 50% of the time spent on counseling and coordinating the care.   Manson Passey, MD [facility] Pager [phone]  If 7PM-7AM, please contact night-coverage [email or web address] Password [facility] 01/11/2016, 9:44 AM

## 2016-01-11 NOTE — Consult Note (Signed)
Referring Physician:  TUNYA HELD is an 70 y.o. female.                       Chief Complaint: Surgical clearance for 70 year old female with acute cholecystitis and good LV systolic function  HPI: 70 year old female with a history of HTN, HLD and diabetes who was admitted from her PCPs office with abdominal pain, nausea and vomiting. The patient was seen at [facility] on the 21st at which time she had a CT scan which showed a stone in the neck of the gallbladder. She was discharged home to follow up with her pcp. Duration of symptoms is 5 days. Onset was sudden. Coarse is unchanged. Location is RUQ with radiation to the back. Symptoms are constant. Aggravated by oral intake. No alleviating factors. Associated with anorexia, malaise, chills and sweats. She has not been able to keep very much down since last Sunday. She was seen by per PCPs office and referred for an admission. Initial work up reveals, WBC 15.7k, k 2.8, 2Cr 2.3, normal LFTs, normal lipase, troponin .11, .03 and .04. Abdominal US revealed gallstones, wall thickening and pericholecystic fluid.She usually walks 2-3 miles per day. Denies any shortness of breath, chest pressure or pain. Her echocardiogram showed good LV systolic function. EKG showed normal sinus rhythm with poor r wave progression.  Past Medical History  Diagnosis Date  . Hypertension   . Diabetes mellitus without complication ([place])       History reviewed. No pertinent past surgical history.  Family History  Problem Relation Age of Onset  . Diabetes type II Other   . Hypertension Other    Social History:  reports that she has never smoked. She does not have any smokeless tobacco history on file. She reports that she does not drink alcohol or use illicit drugs.  Allergies:  Allergies  Allergen Reactions  . Penicillins Hives    Medications Prior to Admission  Medication Sig Dispense Refill  . allopurinol (ZYLOPRIM) 300 MG tablet Take 300 mg by  mouth daily.    Marland Kitchen amLODipine (NORVASC) 5 MG tablet Take 5 mg by mouth daily.    . canagliflozin (INVOKANA) 100 MG TABS tablet Take 100 mg by mouth daily before breakfast.    . cholecalciferol (VITAMIN D) 1000 units tablet Take 1,000 Units by mouth daily.    Marland Kitchen losartan (COZAAR) 100 MG tablet Take 100 mg by mouth daily.    . metoprolol succinate (TOPROL-XL) 25 MG 24 hr tablet Take 25 mg by mouth daily.    . ondansetron (ZOFRAN) 8 MG tablet Take 1 tablet (8 mg total) by mouth every 4 (four) hours as needed. 10 tablet 0  . oxyCODONE-acetaminophen (PERCOCET) 5-325 MG tablet Take 1-2 tablets by mouth every 4 (four) hours as needed. 20 tablet 0  . pravastatin (PRAVACHOL) 20 MG tablet Take 20 mg by mouth daily.    Marland Kitchen FIBER SELECT GUMMIES PO Take 1 tablet by mouth daily as needed (Constipation).      Results for orders placed or performed during the hospital encounter of 01/10/16 (from the past 48 hour(s))  Comprehensive metabolic panel     Status: Abnormal   Collection Time: 01/10/16 10:19 PM  Result Value Ref Range   Sodium 134 (L) 135 - 145 mmol/L   Potassium 2.8 (L) 3.5 - 5.1 mmol/L   Chloride 98 (L) 101 - 111 mmol/L   CO2 28 22 - 32 mmol/L   Glucose, Bld  130 (H) 65 - 99 mg/dL   BUN 39 (H) 6 - 20 mg/dL   Creatinine, Ser 2.30 (H) 0.44 - 1.00 mg/dL   Calcium 8.7 (L) 8.9 - 10.3 mg/dL   Total Protein 6.8 6.5 - 8.1 g/dL   Albumin 2.6 (L) 3.5 - 5.0 g/dL   AST 33 15 - 41 U/L   ALT 27 14 - 54 U/L   Alkaline Phosphatase 86 38 - 126 U/L   Total Bilirubin 0.7 0.3 - 1.2 mg/dL   GFR calc non Af Amer 20 (L) >60 mL/min   GFR calc Af Amer 24 (L) >60 mL/min    Comment: (NOTE) The eGFR has been calculated using the CKD EPI equation. This calculation has not been validated in all clinical situations. eGFR's persistently <60 mL/min signify possible Chronic Kidney Disease.    Anion gap 8 5 - 15  Magnesium     Status: None   Collection Time: 01/10/16 10:19 PM  Result Value Ref Range   Magnesium 2.1 1.7  - 2.4 mg/dL  CBC WITH DIFFERENTIAL     Status: Abnormal   Collection Time: 01/10/16 10:19 PM  Result Value Ref Range   WBC 15.7 (H) 4.0 - 10.5 K/uL   RBC 4.18 3.87 - 5.11 MIL/uL   Hemoglobin 12.9 12.0 - 15.0 g/dL   HCT 38.7 36.0 - 46.0 %   MCV 92.6 78.0 - 100.0 fL   MCH 30.9 26.0 - 34.0 pg   MCHC 33.3 30.0 - 36.0 g/dL   RDW 13.0 11.5 - 15.5 %   Platelets 186 150 - 400 K/uL   Neutrophils Relative % 75 %   Neutro Abs 11.7 (H) 1.7 - 7.7 K/uL   Lymphocytes Relative 12 %   Lymphs Abs 1.9 0.7 - 4.0 K/uL   Monocytes Relative 13 %   Monocytes Absolute 2.1 (H) 0.1 - 1.0 K/uL   Eosinophils Relative 0 %   Eosinophils Absolute 0.0 0.0 - 0.7 K/uL   Basophils Relative 0 %   Basophils Absolute 0.0 0.0 - 0.1 K/uL  APTT     Status: None   Collection Time: 01/10/16 10:19 PM  Result Value Ref Range   aPTT 31 24 - 37 seconds  Protime-INR     Status: Abnormal   Collection Time: 01/10/16 10:19 PM  Result Value Ref Range   Prothrombin Time 15.6 (H) 11.6 - 15.2 seconds   INR 1.22 0.00 - 1.49  Lactic acid, plasma     Status: None   Collection Time: 01/10/16 10:19 PM  Result Value Ref Range   Lactic Acid, Venous 1.1 0.5 - 2.0 mmol/L  Procalcitonin - Baseline     Status: None   Collection Time: 01/10/16 10:19 PM  Result Value Ref Range   Procalcitonin 3.58 ng/mL    Comment:        Interpretation: PCT > 2 ng/mL: Systemic infection (sepsis) is likely, unless other causes are known. (NOTE)         ICU PCT Algorithm               Non ICU PCT Algorithm    ----------------------------     ------------------------------         PCT < 0.25 ng/mL                 PCT < 0.1 ng/mL     Stopping of antibiotics            Stopping of antibiotics  strongly encouraged.               strongly encouraged.    ----------------------------     ------------------------------       PCT level decrease by               PCT < 0.25 ng/mL       >= 80% from peak PCT       OR PCT 0.25 - 0.5 ng/mL          Stopping  of antibiotics                                             encouraged.     Stopping of antibiotics           encouraged.    ----------------------------     ------------------------------       PCT level decrease by              PCT >= 0.25 ng/mL       < 80% from peak PCT        AND PCT >= 0.5 ng/mL            Continuing antibiotics                                               encouraged.       Continuing antibiotics            encouraged.    ----------------------------     ------------------------------     PCT level increase compared          PCT > 0.5 ng/mL         with peak PCT AND          PCT >= 0.5 ng/mL             Escalation of antibiotics                                          strongly encouraged.      Escalation of antibiotics        strongly encouraged.   Lipase, blood     Status: None   Collection Time: 01/10/16 10:19 PM  Result Value Ref Range   Lipase 20 11 - 51 U/L  Amylase     Status: None   Collection Time: 01/10/16 10:19 PM  Result Value Ref Range   Amylase 46 28 - 100 U/L  Troponin I     Status: Abnormal   Collection Time: 01/10/16 10:19 PM  Result Value Ref Range   Troponin I 0.11 (H) <0.031 ng/mL    Comment:        PERSISTENTLY INCREASED TROPONIN VALUES IN THE RANGE OF 0.04-0.49 ng/mL CAN BE SEEN IN:       -UNSTABLE ANGINA       -CONGESTIVE HEART FAILURE       -MYOCARDITIS       -CHEST TRAUMA       -ARRYHTHMIAS       -LATE PRESENTING MYOCARDIAL INFARCTION       -COPD   CLINICAL FOLLOW-UP RECOMMENDED.   Glucose, capillary  Status: Abnormal   Collection Time: 01/10/16 11:03 PM  Result Value Ref Range   Glucose-Capillary 102 (H) 65 - 99 mg/dL  Culture, blood (routine x 2)     Status: None (Preliminary result)   Collection Time: 01/10/16 11:20 PM  Result Value Ref Range   Specimen Description BLOOD LEFT ARM    Special Requests BOTTLES DRAWN AEROBIC AND ANAEROBIC 5ML    Culture  Setup Time      GRAM NEGATIVE RODS ANAEROBIC BOTTLE  ONLY Organism ID to follow    Culture PENDING    Report Status PENDING   Culture, blood (routine x 2)     Status: None (Preliminary result)   Collection Time: 01/10/16 11:30 PM  Result Value Ref Range   Specimen Description BLOOD RIGHT ARM    Special Requests BOTTLES DRAWN AEROBIC AND ANAEROBIC 5ML    Culture  Setup Time GRAM NEGATIVE RODS ANAEROBIC BOTTLE ONLY     Culture PENDING    Report Status PENDING   Urinalysis, Routine w reflex microscopic (not at [facility])     Status: Abnormal   Collection Time: 01/10/16 11:53 PM  Result Value Ref Range   Color, Urine YELLOW YELLOW   APPearance CLEAR CLEAR   Specific Gravity, Urine 1.026 1.005 - 1.030   pH 5.5 5.0 - 8.0   Glucose, UA >1000 (A) NEGATIVE mg/dL   Hgb urine dipstick NEGATIVE NEGATIVE   Bilirubin Urine NEGATIVE NEGATIVE   Ketones, ur NEGATIVE NEGATIVE mg/dL   Protein, ur NEGATIVE NEGATIVE mg/dL   Nitrite NEGATIVE NEGATIVE   Leukocytes, UA NEGATIVE NEGATIVE  Urine microscopic-add on     Status: Abnormal   Collection Time: 01/10/16 11:53 PM  Result Value Ref Range   Squamous Epithelial / LPF 0-5 (A) NONE SEEN   WBC, UA NONE SEEN 0 - 5 WBC/hpf   RBC / HPF NONE SEEN 0 - 5 RBC/hpf   Bacteria, UA RARE (A) NONE SEEN  Lactic acid, plasma     Status: None   Collection Time: 01/11/16 12:30 AM  Result Value Ref Range   Lactic Acid, Venous 0.9 0.5 - 2.0 mmol/L  Troponin I (q 6hr x 3)     Status: None   Collection Time: 01/11/16 12:30 AM  Result Value Ref Range   Troponin I 0.03 <0.031 ng/mL    Comment:        NO INDICATION OF MYOCARDIAL INJURY.   Glucose, capillary     Status: Abnormal   Collection Time: 01/11/16  8:17 AM  Result Value Ref Range   Glucose-Capillary 128 (H) 65 - 99 mg/dL  Troponin I (q 6hr x 3)     Status: Abnormal   Collection Time: 01/11/16  8:27 AM  Result Value Ref Range   Troponin I 0.04 (H) <0.031 ng/mL    Comment:        PERSISTENTLY INCREASED TROPONIN VALUES IN THE RANGE OF 0.04-0.49 ng/mL CAN BE  SEEN IN:       -UNSTABLE ANGINA       -CONGESTIVE HEART FAILURE       -MYOCARDITIS       -CHEST TRAUMA       -ARRYHTHMIAS       -LATE PRESENTING MYOCARDIAL INFARCTION       -COPD   CLINICAL FOLLOW-UP RECOMMENDED.   Basic metabolic panel     Status: Abnormal   Collection Time: 01/11/16  8:27 AM  Result Value Ref Range   Sodium 137 135 - 145 mmol/L  Potassium 3.6 3.5 - 5.1 mmol/L   Chloride 102 101 - 111 mmol/L   CO2 25 22 - 32 mmol/L   Glucose, Bld 134 (H) 65 - 99 mg/dL   BUN 32 (H) 6 - 20 mg/dL   Creatinine, Ser 1.98 (H) 0.44 - 1.00 mg/dL   Calcium 8.7 (L) 8.9 - 10.3 mg/dL   GFR calc non Af Amer 24 (L) >60 mL/min   GFR calc Af Amer 28 (L) >60 mL/min    Comment: (NOTE) The eGFR has been calculated using the CKD EPI equation. This calculation has not been validated in all clinical situations. eGFR's persistently <60 mL/min signify possible Chronic Kidney Disease.    Anion gap 10 5 - 15  Hepatic function panel     Status: Abnormal   Collection Time: 01/11/16 11:19 AM  Result Value Ref Range   Total Protein 6.8 6.5 - 8.1 g/dL   Albumin 2.5 (L) 3.5 - 5.0 g/dL   AST 56 (H) 15 - 41 U/L   ALT 42 14 - 54 U/L   Alkaline Phosphatase 103 38 - 126 U/L   Total Bilirubin 0.8 0.3 - 1.2 mg/dL   Bilirubin, Direct 0.3 0.1 - 0.5 mg/dL   Indirect Bilirubin 0.5 0.3 - 0.9 mg/dL  Glucose, capillary     Status: Abnormal   Collection Time: 01/11/16 12:21 PM  Result Value Ref Range   Glucose-Capillary 104 (H) 65 - 99 mg/dL   US Abdomen Complete  01/11/2016  CLINICAL DATA:  Biliary colic followup EXAM: ABDOMEN ULTRASOUND COMPLETE COMPARISON:  01/06/2016. FINDINGS: Gallbladder: Diffuse gallbladder wall thickening is noted which measures up to 9 mm. Multiple stones are identified within the gallbladder as well as gallbladder sludge. The stones measure up to 11 mm. Pericholecystic fluid is noted. Common bile duct: Diameter: 8.7 mm Liver: The liver has a heterogeneous echotexture. IVC: No abnormality  visualized. Pancreas: Visualized portion unremarkable. Spleen: Size and appearance within normal limits. Right Kidney: Length: 11.7 cm. Perinephric fluid noted. Echogenicity within normal limits. No mass or hydronephrosis visualized. Left Kidney: Length: 10.4 cm. Echogenicity within normal limits. No mass or hydronephrosis visualized. Abdominal aorta: No aneurysm visualized. Other findings: Left pleural effusion noted. IMPRESSION: 1. Gallstones, gallbladder sludge and diffuse gallbladder wall thickening and pericholecystic fluid compatible with acute cholecystitis. 2. Hepatic steatosis suspected. 3. Left pleural effusion 4. Right perinephric fluid noted without hydronephrosis. Electronically Signed   By: Kerby Moors M.D.   On: 01/11/2016 08:19    Review Of Systems Constitutional: Positive for fever, chills and malaise/fatigue. Negative for weight loss and diaphoresis.  Eyes: Negative for blurred vision, double vision, photophobia, pain, discharge and redness.  Respiratory: Negative for cough, hemoptysis, sputum production, shortness of breath and wheezing.  Cardiovascular: Negative for chest pain, palpitations, orthopnea, claudication, leg swelling and PND.  Gastrointestinal: Positive for nausea, vomiting and abdominal pain. Negative for diarrhea, constipation, blood in stool and melena.  Genitourinary: Negative for dysuria, urgency, frequency, hematuria and flank pain.  Musculoskeletal: Negative for myalgias, back pain, joint pain, falls and neck pain.  Neurological: Negative for dizziness, tingling, tremors, sensory change, speech change, focal weakness, seizures, loss of consciousness, weakness and headaches.  Psychiatric/Behavioral: Negative for suicidal ideas, memory loss and substance abuse.   Blood pressure 112/54, pulse 60, temperature 98.6 F (37 C), temperature source Oral, resp. rate 18, height _0  (1.6 m), weight 74.39 kg (164 lb), SpO2 95 %.  Constitutional: NAD, calm,  comfortable Eyes: Brown eyes, PERTLA, lids and conjunctivae normal. Sclera-non-icteric ENMT:  Mucous membranes are moist. Posterior pharynx clear of any exudate or lesions.  Neck: normal, supple, no masses, no thyromegaly Respiratory: clear to auscultation bilaterally, no wheezing, no crackles. Normal respiratory effort.   Cardiovascular: S1 & S2 heard, regular rate and rhythm, no significant murmurs / rubs / gallops. No JVD. Abdomen: No distension, mild tenderness at RUQ, no rebound pain or guarding, no masses palpated. Bowel sounds normal.  Musculoskeletal: no clubbing / cyanosis. No joint deformity upper and lower extremities. Normal muscle tone.  Skin: no significant rashes, lesions, ulcers. Warm, dry. Neurologic: Ax Ox 3 CN 2-12 grossly intact. Sensation intact, Moves all 4 extremities.Marland Kitchen  Psychiatric: Normal mood and affect.   Assessment/Plan Acute cholecystitis Hypertension DM, II  May undergo surgery with usual risks. Will follow.  Birdie Riddle, MD  01/11/2016, 12:31 PM

## 2016-01-11 NOTE — Progress Notes (Signed)
PHARMACY - PHYSICIAN COMMUNICATION CRITICAL VALUE ALERT - BLOOD CULTURE IDENTIFICATION (BCID)  Results for orders placed or performed during the hospital encounter of 01/10/16  Blood Culture ID Panel (Reflexed) (Collected: 01/10/2016 11:20 PM)  Result Value Ref Range   Enterococcus species NOT DETECTED NOT DETECTED   Vancomycin resistance NOT DETECTED NOT DETECTED   Listeria monocytogenes NOT DETECTED NOT DETECTED   Staphylococcus species NOT DETECTED NOT DETECTED   Staphylococcus aureus NOT DETECTED NOT DETECTED   Methicillin resistance NOT DETECTED NOT DETECTED   Streptococcus species NOT DETECTED NOT DETECTED   Streptococcus agalactiae NOT DETECTED NOT DETECTED   Streptococcus pneumoniae NOT DETECTED NOT DETECTED   Streptococcus pyogenes NOT DETECTED NOT DETECTED   Acinetobacter baumannii NOT DETECTED NOT DETECTED   Enterobacteriaceae species DETECTED (A) NOT DETECTED   Enterobacter cloacae complex NOT DETECTED NOT DETECTED   Escherichia coli DETECTED (A) NOT DETECTED   Klebsiella oxytoca NOT DETECTED NOT DETECTED   Klebsiella pneumoniae NOT DETECTED NOT DETECTED   Proteus species NOT DETECTED NOT DETECTED   Serratia marcescens NOT DETECTED NOT DETECTED   Carbapenem resistance NOT DETECTED NOT DETECTED   Haemophilus influenzae NOT DETECTED NOT DETECTED   Neisseria meningitidis NOT DETECTED NOT DETECTED   Pseudomonas aeruginosa NOT DETECTED NOT DETECTED   Candida albicans NOT DETECTED NOT DETECTED   Candida glabrata NOT DETECTED NOT DETECTED   Candida krusei NOT DETECTED NOT DETECTED   Candida parapsilosis NOT DETECTED NOT DETECTED   Candida tropicalis NOT DETECTED NOT DETECTED    Name of physician (or Provider) Contacted: Dr. Manson PasseyAlma Devine @ [phone]  Changes to prescribed antibiotics required: None since patient is already on Rocephin 2 gm IV Q 24 hours.   Vinnie LevelBenjamin Namira Rosekrans, PharmD., BCPS Clinical Pharmacist Pager [phone]

## 2016-01-11 NOTE — Progress Notes (Signed)
Labs return with hyponatremia, hypokalemia, AKI, leukocytosis, elevated PCT, and elevated troponin. EKG without acute ischemic features. Additional plan as follows:   - IVF hydration with NS at 100 cc/hr overnight  - IV potassium 40 mEq, mag level wnl; repeat chem panel in am  - Empiric Rocephin  - Trend troponin measurements, repeat EKG in am

## 2016-01-11 NOTE — Consult Note (Signed)
Reason for Consult:acute cholecystitis  Referring Physician: Dr. Leisa Lenz   HPI: Angel Gonzalez is a 70 year old female with a history of HTN, HLD and diabetes who was admitted from her PCPs office with abdominal pain, nausea and vomiting.  The patient was seen at [facility] on the 21st at which time she had a CT scan which showed a stone in the neck of the gallbladder.  She was discharged home to follow up with her pcp.  Duration of symptoms is 5 days.  Onset was sudden.  Coarse is unchanged.  Location is RUQ with radiation to the back.  Symptoms are constant.  Aggravated by oral intake.  No alleviating factors.  Associated with anorexia, malaise, chills and sweats.  She has not been able to keep very much down since last Sunday.  She was seen by per PCPs office and referred for an admission. Initial work up reveals, WBC 15.7k, k 2.8, 2Cr 2.3, normal LFTs, normal lipase, troponin .11, .03 and .04.  Abdominal US revealed gallstones, wall thickening and pericholecystic fluid.  We have therefore asked to evaluate.  She usually walks 2-3 miles per day.  Denies any shortness of breath, chest pressure or pain.  Does not use anticoagulation.  PSH include a tubal ligation.  Last oral intake was yesterday afternoon.   Past Medical History  Diagnosis Date  . Hypertension   . Diabetes mellitus without complication ([place])     History reviewed. No pertinent past surgical history.  Tubal ligation Breast biopsy   Family History  Problem Relation Age of Onset  . Diabetes type II Other   . Hypertension Other     Social History:  reports that she has never smoked. She does not have any smokeless tobacco history on file. She reports that she does not drink alcohol or use illicit drugs.  Allergies:  Allergies  Allergen Reactions  . Penicillins Hives    Medications:  Scheduled Meds: . cefTRIAXone (ROCEPHIN)  IV  2 g Intravenous Q24H  . heparin  5,000 Units Subcutaneous Q8H  . insulin aspart  0-9 Units  Subcutaneous TID WC  . pravastatin  20 mg Oral q1800  . sodium chloride flush  3 mL Intravenous Q12H   Continuous Infusions:  PRN Meds:.sodium chloride, acetaminophen **OR** acetaminophen, bisacodyl, HYDROcodone-acetaminophen, HYDROmorphone (DILAUDID) injection, ondansetron **OR** ondansetron (ZOFRAN) IV, polyethylene glycol, sodium chloride flush   Results for orders placed or performed during the hospital encounter of 01/10/16 (from the past 48 hour(s))  Comprehensive metabolic panel     Status: Abnormal   Collection Time: 01/10/16 10:19 PM  Result Value Ref Range   Sodium 134 (L) 135 - 145 mmol/L   Potassium 2.8 (L) 3.5 - 5.1 mmol/L   Chloride 98 (L) 101 - 111 mmol/L   CO2 28 22 - 32 mmol/L   Glucose, Bld 130 (H) 65 - 99 mg/dL   BUN 39 (H) 6 - 20 mg/dL   Creatinine, Ser 2.30 (H) 0.44 - 1.00 mg/dL   Calcium 8.7 (L) 8.9 - 10.3 mg/dL   Total Protein 6.8 6.5 - 8.1 g/dL   Albumin 2.6 (L) 3.5 - 5.0 g/dL   AST 33 15 - 41 U/L   ALT 27 14 - 54 U/L   Alkaline Phosphatase 86 38 - 126 U/L   Total Bilirubin 0.7 0.3 - 1.2 mg/dL   GFR calc non Af Amer 20 (L) >60 mL/min   GFR calc Af Amer 24 (L) >60 mL/min    Comment: (NOTE) The  eGFR has been calculated using the CKD EPI equation. This calculation has not been validated in all clinical situations. eGFR's persistently <60 mL/min signify possible Chronic Kidney Disease.    Anion gap 8 5 - 15  Magnesium     Status: None   Collection Time: 01/10/16 10:19 PM  Result Value Ref Range   Magnesium 2.1 1.7 - 2.4 mg/dL  CBC WITH DIFFERENTIAL     Status: Abnormal   Collection Time: 01/10/16 10:19 PM  Result Value Ref Range   WBC 15.7 (H) 4.0 - 10.5 K/uL   RBC 4.18 3.87 - 5.11 MIL/uL   Hemoglobin 12.9 12.0 - 15.0 g/dL   HCT 38.7 36.0 - 46.0 %   MCV 92.6 78.0 - 100.0 fL   MCH 30.9 26.0 - 34.0 pg   MCHC 33.3 30.0 - 36.0 g/dL   RDW 13.0 11.5 - 15.5 %   Platelets 186 150 - 400 K/uL   Neutrophils Relative % 75 %   Neutro Abs 11.7 (H) 1.7 - 7.7  K/uL   Lymphocytes Relative 12 %   Lymphs Abs 1.9 0.7 - 4.0 K/uL   Monocytes Relative 13 %   Monocytes Absolute 2.1 (H) 0.1 - 1.0 K/uL   Eosinophils Relative 0 %   Eosinophils Absolute 0.0 0.0 - 0.7 K/uL   Basophils Relative 0 %   Basophils Absolute 0.0 0.0 - 0.1 K/uL  APTT     Status: None   Collection Time: 01/10/16 10:19 PM  Result Value Ref Range   aPTT 31 24 - 37 seconds  Protime-INR     Status: Abnormal   Collection Time: 01/10/16 10:19 PM  Result Value Ref Range   Prothrombin Time 15.6 (H) 11.6 - 15.2 seconds   INR 1.22 0.00 - 1.49  Lactic acid, plasma     Status: None   Collection Time: 01/10/16 10:19 PM  Result Value Ref Range   Lactic Acid, Venous 1.1 0.5 - 2.0 mmol/L  Procalcitonin - Baseline     Status: None   Collection Time: 01/10/16 10:19 PM  Result Value Ref Range   Procalcitonin 3.58 ng/mL    Comment:        Interpretation: PCT > 2 ng/mL: Systemic infection (sepsis) is likely, unless other causes are known. (NOTE)         ICU PCT Algorithm               Non ICU PCT Algorithm    ----------------------------     ------------------------------         PCT < 0.25 ng/mL                 PCT < 0.1 ng/mL     Stopping of antibiotics            Stopping of antibiotics       strongly encouraged.               strongly encouraged.    ----------------------------     ------------------------------       PCT level decrease by               PCT < 0.25 ng/mL       >= 80% from peak PCT       OR PCT 0.25 - 0.5 ng/mL          Stopping of antibiotics  encouraged.     Stopping of antibiotics           encouraged.    ----------------------------     ------------------------------       PCT level decrease by              PCT >= 0.25 ng/mL       < 80% from peak PCT        AND PCT >= 0.5 ng/mL            Continuing antibiotics                                               encouraged.       Continuing antibiotics             encouraged.    ----------------------------     ------------------------------     PCT level increase compared          PCT > 0.5 ng/mL         with peak PCT AND          PCT >= 0.5 ng/mL             Escalation of antibiotics                                          strongly encouraged.      Escalation of antibiotics        strongly encouraged.   Lipase, blood     Status: None   Collection Time: 01/10/16 10:19 PM  Result Value Ref Range   Lipase 20 11 - 51 U/L  Amylase     Status: None   Collection Time: 01/10/16 10:19 PM  Result Value Ref Range   Amylase 46 28 - 100 U/L  Troponin I     Status: Abnormal   Collection Time: 01/10/16 10:19 PM  Result Value Ref Range   Troponin I 0.11 (H) <0.031 ng/mL    Comment:        PERSISTENTLY INCREASED TROPONIN VALUES IN THE RANGE OF 0.04-0.49 ng/mL CAN BE SEEN IN:       -UNSTABLE ANGINA       -CONGESTIVE HEART FAILURE       -MYOCARDITIS       -CHEST TRAUMA       -ARRYHTHMIAS       -LATE PRESENTING MYOCARDIAL INFARCTION       -COPD   CLINICAL FOLLOW-UP RECOMMENDED.   Glucose, capillary     Status: Abnormal   Collection Time: 01/10/16 11:03 PM  Result Value Ref Range   Glucose-Capillary 102 (H) 65 - 99 mg/dL  Urinalysis, Routine w reflex microscopic (not at [facility])     Status: Abnormal   Collection Time: 01/10/16 11:53 PM  Result Value Ref Range   Color, Urine YELLOW YELLOW   APPearance CLEAR CLEAR   Specific Gravity, Urine 1.026 1.005 - 1.030   pH 5.5 5.0 - 8.0   Glucose, UA >1000 (A) NEGATIVE mg/dL   Hgb urine dipstick NEGATIVE NEGATIVE   Bilirubin Urine NEGATIVE NEGATIVE   Ketones, ur NEGATIVE NEGATIVE mg/dL   Protein, ur NEGATIVE NEGATIVE mg/dL   Nitrite NEGATIVE NEGATIVE   Leukocytes, UA NEGATIVE NEGATIVE  Urine microscopic-add on  Status: Abnormal   Collection Time: 01/10/16 11:53 PM  Result Value Ref Range   Squamous Epithelial / LPF 0-5 (A) NONE SEEN   WBC, UA NONE SEEN 0 - 5 WBC/hpf   RBC / HPF NONE SEEN 0 - 5  RBC/hpf   Bacteria, UA RARE (A) NONE SEEN  Lactic acid, plasma     Status: None   Collection Time: 01/11/16 12:30 AM  Result Value Ref Range   Lactic Acid, Venous 0.9 0.5 - 2.0 mmol/L  Troponin I (q 6hr x 3)     Status: None   Collection Time: 01/11/16 12:30 AM  Result Value Ref Range   Troponin I 0.03 <0.031 ng/mL    Comment:        NO INDICATION OF MYOCARDIAL INJURY.   Glucose, capillary     Status: Abnormal   Collection Time: 01/11/16  8:17 AM  Result Value Ref Range   Glucose-Capillary 128 (H) 65 - 99 mg/dL  Troponin I (q 6hr x 3)     Status: Abnormal   Collection Time: 01/11/16  8:27 AM  Result Value Ref Range   Troponin I 0.04 (H) <0.031 ng/mL    Comment:        PERSISTENTLY INCREASED TROPONIN VALUES IN THE RANGE OF 0.04-0.49 ng/mL CAN BE SEEN IN:       -UNSTABLE ANGINA       -CONGESTIVE HEART FAILURE       -MYOCARDITIS       -CHEST TRAUMA       -ARRYHTHMIAS       -LATE PRESENTING MYOCARDIAL INFARCTION       -COPD   CLINICAL FOLLOW-UP RECOMMENDED.   Basic metabolic panel     Status: Abnormal   Collection Time: 01/11/16  8:27 AM  Result Value Ref Range   Sodium 137 135 - 145 mmol/L   Potassium 3.6 3.5 - 5.1 mmol/L   Chloride 102 101 - 111 mmol/L   CO2 25 22 - 32 mmol/L   Glucose, Bld 134 (H) 65 - 99 mg/dL   BUN 32 (H) 6 - 20 mg/dL   Creatinine, Ser 1.98 (H) 0.44 - 1.00 mg/dL   Calcium 8.7 (L) 8.9 - 10.3 mg/dL   GFR calc non Af Amer 24 (L) >60 mL/min   GFR calc Af Amer 28 (L) >60 mL/min    Comment: (NOTE) The eGFR has been calculated using the CKD EPI equation. This calculation has not been validated in all clinical situations. eGFR's persistently <60 mL/min signify possible Chronic Kidney Disease.    Anion gap 10 5 - 15    US Abdomen Complete  01/11/2016  CLINICAL DATA:  Biliary colic followup EXAM: ABDOMEN ULTRASOUND COMPLETE COMPARISON:  01/06/2016. FINDINGS: Gallbladder: Diffuse gallbladder wall thickening is noted which measures up to 9 mm. Multiple  stones are identified within the gallbladder as well as gallbladder sludge. The stones measure up to 11 mm. Pericholecystic fluid is noted. Common bile duct: Diameter: 8.7 mm Liver: The liver has a heterogeneous echotexture. IVC: No abnormality visualized. Pancreas: Visualized portion unremarkable. Spleen: Size and appearance within normal limits. Right Kidney: Length: 11.7 cm. Perinephric fluid noted. Echogenicity within normal limits. No mass or hydronephrosis visualized. Left Kidney: Length: 10.4 cm. Echogenicity within normal limits. No mass or hydronephrosis visualized. Abdominal aorta: No aneurysm visualized. Other findings: Left pleural effusion noted. IMPRESSION: 1. Gallstones, gallbladder sludge and diffuse gallbladder wall thickening and pericholecystic fluid compatible with acute cholecystitis. 2. Hepatic steatosis suspected. 3. Left pleural effusion 4.  Right perinephric fluid noted without hydronephrosis. Electronically Signed   By: Kerby Moors M.D.   On: 01/11/2016 08:19    Review of Systems  Constitutional: Positive for fever, chills and malaise/fatigue. Negative for weight loss and diaphoresis.  Eyes: Negative for blurred vision, double vision, photophobia, pain, discharge and redness.  Respiratory: Negative for cough, hemoptysis, sputum production, shortness of breath and wheezing.   Cardiovascular: Negative for chest pain, palpitations, orthopnea, claudication, leg swelling and PND.  Gastrointestinal: Positive for nausea, vomiting and abdominal pain. Negative for diarrhea, constipation, blood in stool and melena.  Genitourinary: Negative for dysuria, urgency, frequency, hematuria and flank pain.  Musculoskeletal: Negative for myalgias, back pain, joint pain, falls and neck pain.  Neurological: Negative for dizziness, tingling, tremors, sensory change, speech change, focal weakness, seizures, loss of consciousness, weakness and headaches.  Psychiatric/Behavioral: Negative for suicidal  ideas, memory loss and substance abuse.   Blood pressure 112/54, pulse 60, temperature 98.6 F (37 C), temperature source Oral, resp. rate 18, height '5\' 3"'$  (1.6 m), weight 74.39 kg (164 lb), SpO2 95 %. Physical Exam  Constitutional: She is oriented to person, place, and time. She appears well-developed and well-nourished. No distress.  HENT:  Head: Normocephalic and atraumatic.  Mouth/Throat: No oropharyngeal exudate.  Eyes: No scleral icterus.  Cardiovascular: Normal rate, regular rhythm, normal heart sounds and intact distal pulses.  Exam reveals no gallop and no friction rub.   No murmur heard. Respiratory: Effort normal and breath sounds normal. No respiratory distress. She has no wheezes. She has no rales. She exhibits no tenderness.  GI: Soft. Bowel sounds are normal. She exhibits no distension and no mass. There is no rebound and no guarding.  TTP RUQ  Musculoskeletal: Normal range of motion. She exhibits no edema or tenderness.  Neurological: She is alert and oriented to person, place, and time.  Skin: Skin is dry. No rash noted. She is not diaphoretic. No erythema. No pallor.  Psychiatric: She has a normal mood and affect. Her behavior is normal. Judgment and thought content normal.    Assessment/Plan: Acute calculous cholecystitis-recommend a laparoscopic cholecystectomy if echocardiogram okay/cleared by cardiology.  Surgical risks discussed including infection, bleeding, injury to surrounding structures, open surgery, heart attack, DVT/PE, stroke and respiratory compromise the patient verbalizes understanding and wishes to proceed.  Timing will depend on cardiac clearance and then OR availability.  Will keep her NPO for now in case we can do her later today. AKI-improving, IVF DM II-SSI CBGs CV-htn, troponin elevation  ID-rocephin FEN-NPO, IVF  Arlina Sabina ANP-BC Pager [phone] 01/11/2016, 11:01 AM

## 2016-01-11 NOTE — Anesthesia Preprocedure Evaluation (Addendum)
Anesthesia Evaluation  Patient identified by MRN, date of birth, ID band Patient awake    Reviewed: Allergy & Precautions, NPO status , Patient's Chart, lab work & pertinent test results  History of Anesthesia Complications Negative for: history of anesthetic complications  Airway Mallampati: II  TM Distance: >3 FB Neck ROM: Full    Dental  (+) Chipped, Dental Advisory Given,    Pulmonary former smoker,    breath sounds clear to auscultation       Cardiovascular hypertension, Pt. on medications and Pt. on home beta blockers (-) angina Rhythm:Regular Rate:Normal  01/11/16 ECHO: EF 55-60%, valves OK   Neuro/Psych negative neurological ROS     GI/Hepatic Neg liver ROS, N/V with acute chole   Endo/Other  diabetes (glu 98), Type 2, Oral Hypoglycemic Agents  Renal/GU Renal InsufficiencyRenal disease (creat 1.98)     Musculoskeletal   Abdominal (+)  Abdomen: soft.    Peds  Hematology negative hematology ROS (+)   Anesthesia Other Findings   Reproductive/Obstetrics                         Anesthesia Physical Anesthesia Plan  ASA: II  Anesthesia Plan: General   Post-op Pain Management:    Induction: Intravenous and Rapid sequence  Airway Management Planned: Oral ETT  Additional Equipment:   Intra-op Plan:   Post-operative Plan: Extubation in OR  Informed Consent: I have reviewed the patients History and Physical, chart, labs and discussed the procedure including the risks, benefits and alternatives for the proposed anesthesia with the patient or authorized representative who has indicated his/her understanding and acceptance.   Dental advisory given  Plan Discussed with: CRNA, Anesthesiologist and Surgeon  Anesthesia Plan Comments: (Plan routine monitors, GETA)       Anesthesia Quick Evaluation

## 2016-01-11 NOTE — Progress Notes (Signed)
  Echocardiogram 2D Echocardiogram has been performed.  Kanaan Kagawa 01/11/2016, 11:26 AM

## 2016-01-12 ENCOUNTER — Observation Stay (HOSPITAL_COMMUNITY): Payer: Medicare Other | Admitting: Certified Registered Nurse Anesthetist

## 2016-01-12 ENCOUNTER — Encounter (HOSPITAL_COMMUNITY)
Admission: AD | Disposition: A | Discharge status: Home / Self Care | Payer: Self-pay | Source: Ambulatory Visit | Attending: Internal Medicine

## 2016-01-12 ENCOUNTER — Observation Stay (HOSPITAL_COMMUNITY): Payer: Medicare Other

## 2016-01-12 DIAGNOSIS — N179 Acute kidney failure, unspecified: Secondary | ICD-10-CM | POA: Diagnosis not present

## 2016-01-12 DIAGNOSIS — K810 Acute cholecystitis: Secondary | ICD-10-CM | POA: Diagnosis not present

## 2016-01-12 DIAGNOSIS — K8050 Calculus of bile duct without cholangitis or cholecystitis without obstruction: Secondary | ICD-10-CM | POA: Diagnosis not present

## 2016-01-12 DIAGNOSIS — R7881 Bacteremia: Secondary | ICD-10-CM | POA: Diagnosis not present

## 2016-01-12 HISTORY — PX: CHOLECYSTECTOMY: SHX55

## 2016-01-12 LAB — GLUCOSE, CAPILLARY
GLUCOSE-CAPILLARY: 98 mg/dL (ref 65–99)
GLUCOSE-CAPILLARY: 115 mg/dL — AB (ref 65–99)
GLUCOSE-CAPILLARY: 121 mg/dL — AB (ref 65–99)
GLUCOSE-CAPILLARY: 178 mg/dL — AB (ref 65–99)
Glucose-Capillary: 115 mg/dL — ABNORMAL HIGH (ref 65–99)

## 2016-01-12 LAB — HEMOGLOBIN A1C
HEMOGLOBIN A1C: 6.4 % — AB (ref 4.8–5.6)
Mean Plasma Glucose: 137 mg/dL

## 2016-01-12 LAB — PROCALCITONIN: Procalcitonin: 4.24 ng/mL

## 2016-01-12 SURGERY — LAPAROSCOPIC CHOLECYSTECTOMY WITH INTRAOPERATIVE CHOLANGIOGRAM
Anesthesia: General

## 2016-01-12 MED ORDER — SODIUM CHLORIDE 0.9 % IV SOLN
INTRAVENOUS | Status: DC | PRN
  Administered 2016-01-12: 5 mL

## 2016-01-12 MED ORDER — BUPIVACAINE-EPINEPHRINE 0.25% -1:200000 IJ SOLN
INTRAMUSCULAR | Status: DC | PRN
  Administered 2016-01-12: 8 mL

## 2016-01-12 MED ORDER — ONDANSETRON HCL 4 MG/2ML IJ SOLN
INTRAMUSCULAR | Status: DC | PRN
  Administered 2016-01-12: 4 mg via INTRAVENOUS

## 2016-01-12 MED ORDER — BUPIVACAINE-EPINEPHRINE (PF) 0.25% -1:200000 IJ SOLN
INTRAMUSCULAR | Status: AC
  Filled 2016-01-12: qty 30

## 2016-01-12 MED ORDER — HEMOSTATIC AGENTS (NO CHARGE) OPTIME
TOPICAL | Status: DC | PRN
  Administered 2016-01-12 (×2): 1 via TOPICAL

## 2016-01-12 MED ORDER — SUGAMMADEX SODIUM 200 MG/2ML IV SOLN
INTRAVENOUS | Status: DC | PRN
  Administered 2016-01-12: 200 mg via INTRAVENOUS

## 2016-01-12 MED ORDER — FENTANYL CITRATE (PF) 250 MCG/5ML IJ SOLN
INTRAMUSCULAR | Status: AC
  Filled 2016-01-12: qty 5

## 2016-01-12 MED ORDER — MIDAZOLAM HCL 2 MG/2ML IJ SOLN
INTRAMUSCULAR | Status: AC
  Filled 2016-01-12: qty 2

## 2016-01-12 MED ORDER — SODIUM CHLORIDE 0.9 % IR SOLN
Status: DC | PRN
  Administered 2016-01-12 (×4): 1000 mL

## 2016-01-12 MED ORDER — HEPARIN SODIUM (PORCINE) 5000 UNIT/ML IJ SOLN
5000.0000 [IU] | Freq: Three times a day (TID) | INTRAMUSCULAR | Status: DC
  Administered 2016-01-13 – 2016-01-19 (×19): 5000 [IU] via SUBCUTANEOUS
  Filled 2016-01-12 (×15): qty 1

## 2016-01-12 MED ORDER — ROCURONIUM BROMIDE 50 MG/5ML IV SOLN
INTRAVENOUS | Status: AC
  Filled 2016-01-12: qty 1

## 2016-01-12 MED ORDER — LIDOCAINE HCL (CARDIAC) 20 MG/ML IV SOLN
INTRAVENOUS | Status: DC | PRN
  Administered 2016-01-12: 20 mg via INTRAVENOUS

## 2016-01-12 MED ORDER — IOPAMIDOL (ISOVUE-300) INJECTION 61%
INTRAVENOUS | Status: AC
  Filled 2016-01-12: qty 50

## 2016-01-12 MED ORDER — PROPOFOL 10 MG/ML IV BOLUS
INTRAVENOUS | Status: AC
  Filled 2016-01-12: qty 20

## 2016-01-12 MED ORDER — MEPERIDINE HCL 25 MG/ML IJ SOLN: 6.2500 mg | INTRAMUSCULAR | Status: DC | PRN

## 2016-01-12 MED ORDER — SUCCINYLCHOLINE CHLORIDE 20 MG/ML IJ SOLN
INTRAMUSCULAR | Status: DC | PRN
  Administered 2016-01-12: 100 mg via INTRAVENOUS

## 2016-01-12 MED ORDER — PROPOFOL 10 MG/ML IV BOLUS
INTRAVENOUS | Status: DC | PRN
  Administered 2016-01-12: 100 mg via INTRAVENOUS

## 2016-01-12 MED ORDER — ALUM & MAG HYDROXIDE-SIMETH 200-200-20 MG/5ML PO SUSP
30.0000 mL | ORAL | Status: DC | PRN
  Administered 2016-01-12 – 2016-01-13 (×2): 30 mL via ORAL
  Filled 2016-01-12 (×2): qty 30

## 2016-01-12 MED ORDER — ROCURONIUM BROMIDE 100 MG/10ML IV SOLN
INTRAVENOUS | Status: DC | PRN
  Administered 2016-01-12: 20 mg via INTRAVENOUS
  Administered 2016-01-12: 30 mg via INTRAVENOUS

## 2016-01-12 MED ORDER — ONDANSETRON HCL 4 MG/2ML IJ SOLN
INTRAMUSCULAR | Status: AC
  Filled 2016-01-12: qty 2

## 2016-01-12 MED ORDER — METOPROLOL SUCCINATE ER 25 MG PO TB24
25.0000 mg | ORAL_TABLET | Freq: Every day | ORAL | Status: DC
  Administered 2016-01-13 – 2016-01-19 (×7): 25 mg via ORAL
  Filled 2016-01-12 (×7): qty 1

## 2016-01-12 MED ORDER — HYDROMORPHONE HCL 1 MG/ML IJ SOLN: 0.2500 mg | INTRAMUSCULAR | Status: DC | PRN

## 2016-01-12 MED ORDER — METOPROLOL SUCCINATE ER 25 MG PO TB24
25.0000 mg | ORAL_TABLET | ORAL | Status: AC
  Administered 2016-01-12: 25 mg via ORAL
  Filled 2016-01-12: qty 1

## 2016-01-12 MED ORDER — BUPIVACAINE HCL (PF) 0.25 % IJ SOLN
INTRAMUSCULAR | Status: AC
  Filled 2016-01-12: qty 30

## 2016-01-12 MED ORDER — 0.9 % SODIUM CHLORIDE (POUR BTL) OPTIME
TOPICAL | Status: DC | PRN
  Administered 2016-01-12: 1000 mL

## 2016-01-12 MED ORDER — LACTATED RINGERS IV SOLN
INTRAVENOUS | Status: DC | PRN
  Administered 2016-01-12 (×2): via INTRAVENOUS

## 2016-01-12 MED ORDER — FENTANYL CITRATE (PF) 100 MCG/2ML IJ SOLN
INTRAMUSCULAR | Status: DC | PRN
  Administered 2016-01-12 (×2): 50 ug via INTRAVENOUS
  Administered 2016-01-12: 100 ug via INTRAVENOUS

## 2016-01-12 MED ORDER — MIDAZOLAM HCL 2 MG/2ML IJ SOLN: 0.5000 mg | Freq: Once | INTRAMUSCULAR | Status: DC | PRN

## 2016-01-12 MED ORDER — MIDAZOLAM HCL 5 MG/5ML IJ SOLN
INTRAMUSCULAR | Status: DC | PRN
  Administered 2016-01-12 (×2): 1 mg via INTRAVENOUS

## 2016-01-12 SURGICAL SUPPLY — 40 items
APPLIER CLIP ROT 10 11.4 M/L (STAPLE) ×6
CANISTER SUCTION 2500CC (MISCELLANEOUS) ×3 IMPLANT
CHLORAPREP W/TINT 26ML (MISCELLANEOUS) ×3 IMPLANT
CLIP APPLIE ROT 10 11.4 M/L (STAPLE) ×2 IMPLANT
CONT SPEC 4OZ CLIKSEAL STRL BL (MISCELLANEOUS) ×3 IMPLANT
COVER MAYO STAND STRL (DRAPES) ×3 IMPLANT
COVER SURGICAL LIGHT HANDLE (MISCELLANEOUS) ×3 IMPLANT
DRAIN CHANNEL 19F RND (DRAIN) ×3 IMPLANT
DRAPE C-ARM 42X72 X-RAY (DRAPES) ×3 IMPLANT
ELECT REM PT RETURN 9FT ADLT (ELECTROSURGICAL) ×3
ELECTRODE REM PT RTRN 9FT ADLT (ELECTROSURGICAL) ×1 IMPLANT
EVACUATOR SILICONE 100CC (DRAIN) ×3 IMPLANT
GAUZE SPONGE 2X2 8PLY STRL LF (GAUZE/BANDAGES/DRESSINGS) ×1 IMPLANT
GLOVE BIO SURGEON STRL SZ8 (GLOVE) ×3 IMPLANT
GLOVE BIOGEL PI IND STRL 8 (GLOVE) ×1 IMPLANT
GLOVE BIOGEL PI INDICATOR 8 (GLOVE) ×2
GOWN STRL REUS W/ TWL LRG LVL3 (GOWN DISPOSABLE) ×2 IMPLANT
GOWN STRL REUS W/ TWL XL LVL3 (GOWN DISPOSABLE) ×1 IMPLANT
GOWN STRL REUS W/TWL LRG LVL3 (GOWN DISPOSABLE) ×4
GOWN STRL REUS W/TWL XL LVL3 (GOWN DISPOSABLE) ×2
KIT BASIN OR (CUSTOM PROCEDURE TRAY) ×3 IMPLANT
KIT ROOM TURNOVER OR (KITS) ×3 IMPLANT
LIQUID BAND (GAUZE/BANDAGES/DRESSINGS) ×3 IMPLANT
NS IRRIG 1000ML POUR BTL (IV SOLUTION) ×3 IMPLANT
PAD ARMBOARD 7.5X6 YLW CONV (MISCELLANEOUS) ×3 IMPLANT
POUCH SPECIMEN RETRIEVAL 10MM (ENDOMECHANICALS) ×3 IMPLANT
SCISSORS LAP 5X35 DISP (ENDOMECHANICALS) ×3 IMPLANT
SET CHOLANGIOGRAPH 5 50 .035 (SET/KITS/TRAYS/PACK) ×3 IMPLANT
SET IRRIG TUBING LAPAROSCOPIC (IRRIGATION / IRRIGATOR) ×3 IMPLANT
SLEEVE ENDOPATH XCEL 5M (ENDOMECHANICALS) ×3 IMPLANT
SPONGE GAUZE 2X2 STER 10/PKG (GAUZE/BANDAGES/DRESSINGS) ×2
SUT ETHILON 2 0 FS 18 (SUTURE) ×3 IMPLANT
SUT MNCRL AB 4-0 PS2 18 (SUTURE) ×3 IMPLANT
TOWEL OR 17X24 6PK STRL BLUE (TOWEL DISPOSABLE) ×3 IMPLANT
TOWEL OR 17X26 10 PK STRL BLUE (TOWEL DISPOSABLE) ×3 IMPLANT
TRAY LAPAROSCOPIC MC (CUSTOM PROCEDURE TRAY) ×3 IMPLANT
TROCAR XCEL BLUNT TIP 100MML (ENDOMECHANICALS) ×3 IMPLANT
TROCAR XCEL NON-BLD 11X100MML (ENDOMECHANICALS) ×3 IMPLANT
TROCAR XCEL NON-BLD 5MMX100MML (ENDOMECHANICALS) ×3 IMPLANT
TUBING INSUFFLATION (TUBING) ×3 IMPLANT

## 2016-01-12 NOTE — Transfer of Care (Signed)
Immediate Anesthesia Transfer of Care Note  Patient: Angel Gonzalez  Procedure(s) Performed: Procedure(s): LAPAROSCOPIC CHOLECYSTECTOMY WITH INTRAOPERATIVE CHOLANGIOGRAM (N/A)  Patient Location: PACU  Anesthesia Type:General  Level of Consciousness: awake, alert  and oriented  Airway & Oxygen Therapy: Patient Spontanous Breathing and Patient connected to nasal cannula oxygen  Post-op Assessment: Report given to RN and Post -op Vital signs reviewed and stable  Post vital signs: Reviewed and stable  Last Vitals:  Filed Vitals:   01/12/16 0510 01/12/16 0728  BP: 132/83 143/76  Pulse: 81 85  Temp: 36.9 C   Resp: 18     Last Pain:  Filed Vitals:   01/12/16 0940  PainSc: Asleep         Complications: No apparent anesthesia complications

## 2016-01-12 NOTE — Progress Notes (Addendum)
Patient ID: Angel Gonzalez, female   DOB: 05/16/1946, 70 y.o.   MRN: [id number]  PROGRESS NOTE    JESSLYNN KRUCK  YQM:[id number] DOB: 1946-02-06 DOA: 01/10/2016  PCP: [facility]   Brief Narrative:  70 year-old female with past medical history significant for hypertension, dyslipidemia, diabetes. Patient was sent from primary care office for evaluation of biliary colic. Patient started having right upper quadrant abdominal pain 01/06/2016 which was associated with nausea and vomiting and then it spontaneously resolved after 30 minutes. Since then however she continued to have recurrence of this pain with increased frequency and duration. She was not able to tolerate any by mouth intake because of nausea and vomiting. She has been seen in [facility] emergency department 01/06/2016 were CT scan demonstrated 12 mm stone in gallbladder neck but there was no evidence of acute cholecystitis so she was subsequently discharged home. Because of ongoing pain and nausea and vomiting she presented to primary care office and was told to come to [facility] for further evaluation.  Patient was hemodynamically stable on the admission. Blood work was notable for white blood cell count of 15.7, potassium 2.8, creatinine 2.30, normal lipase and normal LFTs. Her abdominal ultrasound significant for acute cholecystitis. Surgery has seen her in consultation. She is s/p lap cholecystectomy 5/27.   Assessment & Plan:   Principal Problem:   Biliary colic / Acute cholecystitis / leukocytosis - Normal liver function enzymes, normal lipase - Abdominal ultrasound significant for acute cholecystitis - S/P lap cholecystectomy 5/27 - Continue rocephin - Appreciate surgery following - Continue IV fluids - Continue pain management efforts  Active Problems:   E.Coli bacteremia - Blood cultures on admission growing E.Coli - Continue rocephin - Repeat blood cultures tomorrow to ensure resolution of  bacteremia     Mild troponin elevation  - Likely secondary to demand ischemia in the setting of acute cholecystitis - Troponin 0.11, 0.03, 0.04 - No acute ischemic changes on admission 12-lead EKG - 2 D ECHO with normal EF - Cardio following     Essential hypertension - Resumed metoprolol     Hyperlipidemia - Continue Pravachol    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 (HCC) - A1c is 6.4 - Continue SSI    Hypokalemia - Likely due to GI losses - Supplemented and within normal limits    Acute kidney injury - Creatinine 2.3, improving with hydration  - Cr on 5/21 was 1.4 but no other values for comparison     DVT prophylaxis: Heparin subcutaneous Code Status: full code  Family Communication: Family not at the bedside Disposition Plan: Anticipate discharge once cleared by surgery    Consultants:   Surgery  Cardiology   Procedures:   Lap chole 01/12/2016   2 D ECHO 01/11/2016 - normal EF, grade 1 DD  Antimicrobials:   Rocephin 01/10/2016 -->   Subjective: No overnight events.   Objective: Filed Vitals:   01/12/16 1015 01/12/16 1030 01/12/16 1045 01/12/16 1052  BP: 141/85 142/75  138/71  Pulse: 81 85 87 86  Temp:   97.8 F (36.6 C) 98.6 F (37 C)  TempSrc:    Oral  Resp: Height:      Weight:      SpO2: 97% 97% 96% 97%    Intake/Output Summary (Last 24 hours) at 01/12/16 1317 Last data filed at 01/12/16 1045  Gross per 24 hour  Intake 3223.25 ml  Output  735 ml  Net 2488.25 ml   Filed Weights   01/10/16 1905  Weight: 74.39 kg (164 lb)    Examination:  General exam: Appears in no acute distress  Respiratory system: Respiratory effort normal. No wheezing  Cardiovascular system: S1 & S2 (+) , RRR Gastrointestinal system: (+) BS no distention  Central nervous system: No focal neurological deficits. Extremities: No edema, palpable pulses  Skin: warm, dry  Psychiatry: No agitation, no  restlessness   Data Reviewed: I have personally reviewed following labs and imaging studies  CBC:  Recent Labs Lab 01/06/16 1815 01/06/16 1854 01/10/16 2219  WBC 9.5  --  15.7*  NEUTROABS 5.1  --  11.7*  HGB 14.9 17.3* 12.9  HCT 44.1 51.0* 38.7  MCV 94.2  --  92.6  PLT 201  --  186   Basic Metabolic Panel:  Recent Labs Lab 01/06/16 1815 01/06/16 1854 01/10/16 2219 01/11/16 0827  NA 139 145 134* 137  K 3.1* 3.2* 2.8* 3.6  CL 103 104 98* 102  CO2 27  --  28 25  GLUCOSE 120* 120* 130* 134*  BUN 28* 33* 39* 32*  CREATININE 1.44* 1.40* 2.30* 1.98*  CALCIUM 9.4  --  8.7* 8.7*  MG  --   --  2.1  --    GFR: Estimated Creatinine Clearance: 25.5 mL/min (by C-G formula based on Cr of 1.98). Liver Function Tests:  Recent Labs Lab 01/06/16 1815 01/10/16 2219 01/11/16 1119  AST 25 33 56*  ALT 18 27 42  ALKPHOS 82 86 103  BILITOT 0.5 0.7 0.8  PROT 8.4* 6.8 6.8  ALBUMIN 4.4 2.6* 2.5*    Recent Labs Lab 01/06/16 1815 01/10/16 2219  LIPASE 40 20  AMYLASE  --  46   No results for input(s): AMMONIA in the last 168 hours. Coagulation Profile:  Recent Labs Lab 01/10/16 2219  INR 1.22   Cardiac Enzymes:  Recent Labs Lab 01/10/16 2219 01/11/16 0030 01/11/16 0827 01/11/16 1152  TROPONINI 0.11* 0.03 0.04* 0.06*   BNP (last 3 results) No results for input(s): PROBNP in the last 8760 hours. HbA1C:  Recent Labs  01/10/16 2219  HGBA1C 6.4*   CBG:  Recent Labs Lab 01/11/16 1648 01/11/16 2121 01/12/16 0705 01/12/16 0943 01/12/16 1150  GLUCAP 127* 106* 98 115* 121*   Lipid Profile: No results for input(s): CHOL, HDL, LDLCALC, TRIG, CHOLHDL, LDLDIRECT in the last 72 hours. Thyroid Function Tests: No results for input(s): TSH, T4TOTAL, FREET4, T3FREE, THYROIDAB in the last 72 hours. Anemia Panel: No results for input(s): VITAMINB12, FOLATE, FERRITIN, TIBC, IRON, RETICCTPCT in the last 72 hours. Urine analysis:    Component Value Date/Time    COLORURINE YELLOW 01/10/2016 2353   APPEARANCEUR CLEAR 01/10/2016 2353   LABSPEC 1.026 01/10/2016 2353   PHURINE 5.5 01/10/2016 2353   GLUCOSEU >1000* 01/10/2016 2353   HGBUR NEGATIVE 01/10/2016 2353   BILIRUBINUR NEGATIVE 01/10/2016 2353   KETONESUR NEGATIVE 01/10/2016 2353   PROTEINUR NEGATIVE 01/10/2016 2353   NITRITE NEGATIVE 01/10/2016 2353   LEUKOCYTESUR NEGATIVE 01/10/2016 2353   Sepsis Labs: @LABRCNTIP (procalcitonin:4,lacticidven:4)   ) Recent Results (from the past 240 hour(s))  Culture, blood (routine x 2)     Status: Abnormal (Preliminary result)   Collection Time: 01/10/16 11:20 PM  Result Value Ref Range Status   Specimen Description BLOOD LEFT ARM  Final   Special Requests BOTTLES DRAWN AEROBIC AND ANAEROBIC 5ML  Final   Culture  Setup Time   Final  GRAM NEGATIVE RODS IN BOTH AEROBIC AND ANAEROBIC BOTTLES CRITICAL RESULT CALLED TO, READ BACK BY AND VERIFIED WITH: Despina Pole AT 1509 01/11/16 BY L BENFIELD    Culture ESCHERICHIA COLI (A)  Final   Report Status PENDING  Incomplete  Blood Culture ID Panel (Reflexed)     Status: Abnormal   Collection Time: 01/10/16 11:20 PM  Result Value Ref Range Status   Enterococcus species NOT DETECTED NOT DETECTED Final   Vancomycin resistance NOT DETECTED NOT DETECTED Final   Listeria monocytogenes NOT DETECTED NOT DETECTED Final   Staphylococcus species NOT DETECTED NOT DETECTED Final   Staphylococcus aureus NOT DETECTED NOT DETECTED Final   Methicillin resistance NOT DETECTED NOT DETECTED Final   Streptococcus species NOT DETECTED NOT DETECTED Final   Streptococcus agalactiae NOT DETECTED NOT DETECTED Final   Streptococcus pneumoniae NOT DETECTED NOT DETECTED Final   Streptococcus pyogenes NOT DETECTED NOT DETECTED Final   Acinetobacter baumannii NOT DETECTED NOT DETECTED Final   Enterobacteriaceae species DETECTED (A) NOT DETECTED Final    Comment: CRITICAL RESULT CALLED TO, READ BACK BY AND VERIFIED WITH: M  MANCHERIL,PHARMD AT 1509 01/11/16 BY L BENFIELD    Enterobacter cloacae complex NOT DETECTED NOT DETECTED Final   Escherichia coli DETECTED (A) NOT DETECTED Final    Comment: CRITICAL RESULT CALLED TO, READ BACK BY AND VERIFIED WITH: M MANCHERIL,PHARMD AT 1509 01/11/16 BY L BENFIELD    Klebsiella oxytoca NOT DETECTED NOT DETECTED Final   Klebsiella pneumoniae NOT DETECTED NOT DETECTED Final   Proteus species NOT DETECTED NOT DETECTED Final   Serratia marcescens NOT DETECTED NOT DETECTED Final   Carbapenem resistance NOT DETECTED NOT DETECTED Final   Haemophilus influenzae NOT DETECTED NOT DETECTED Final   Neisseria meningitidis NOT DETECTED NOT DETECTED Final   Pseudomonas aeruginosa NOT DETECTED NOT DETECTED Final   Candida albicans NOT DETECTED NOT DETECTED Final   Candida glabrata NOT DETECTED NOT DETECTED Final   Candida krusei NOT DETECTED NOT DETECTED Final   Candida parapsilosis NOT DETECTED NOT DETECTED Final   Candida tropicalis NOT DETECTED NOT DETECTED Final  Culture, blood (routine x 2)     Status: Abnormal (Preliminary result)   Collection Time: 01/10/16 11:30 PM  Result Value Ref Range Status   Specimen Description BLOOD RIGHT ARM  Final   Special Requests BOTTLES DRAWN AEROBIC AND ANAEROBIC  Final   Culture  Setup Time   Final    GRAM NEGATIVE RODS IN BOTH AEROBIC AND ANAEROBIC BOTTLES CRITICAL RESULT CALLED TO, READ BACK BY AND VERIFIED WITH: M MANCHERIL,PHARMD AT 1509 01/11/16 BY L BENFIELD    Culture ESCHERICHIA COLI (A)  Final   Report Status PENDING  Incomplete  Surgical pcr screen     Status: None   Collection Time: 01/11/16  4:25 PM  Result Value Ref Range Status   MRSA, PCR NEGATIVE NEGATIVE Final   Staphylococcus aureus NEGATIVE NEGATIVE Final    Comment:        The Xpert SA Assay (FDA approved for NASAL specimens in patients over 25 years of age), is one component of a comprehensive surveillance program.  Test performance has been validated by  [facility] for patients greater than or equal to 73 year old. It is not intended to diagnose infection nor to guide or monitor treatment.       Radiology Studies: US Abdomen Complete 01/11/2016  1. Gallstones, gallbladder sludge and diffuse gallbladder wall thickening and pericholecystic fluid compatible with acute cholecystitis. 2. Hepatic steatosis  suspected. 3. Left pleural effusion 4. Right perinephric fluid noted without hydronephrosis. Electronically Signed   By: Signa Kell M.D.   On: 01/11/2016 08:19    Scheduled Meds: . cefTRIAXone (ROCEPHIN)  IV  2 g Intravenous Q24H  . heparin  5,000 Units Subcutaneous Q8H  . insulin aspart  0-9 Units Subcutaneous TID WC  . metoprolol succinate  25 mg Oral Daily  . ondansetron      . pravastatin  20 mg Oral q1800   Continuous Infusions: . sodium chloride 75 mL/hr at 01/12/16 0445     LOS: 2 days    Time spent: 15 minutes  Greater than 50% of the time spent on counseling and coordinating the care.   Manson Passey, MD [facility] Pager [phone]  If 7PM-7AM, please contact night-coverage [email or web address] Password [email or web address] 01/12/2016, 1:17 PM

## 2016-01-12 NOTE — Anesthesia Postprocedure Evaluation (Signed)
Anesthesia Post Note  Patient: Angel Gonzalez  Procedure(s) Performed: Procedure(s) (LRB): LAPAROSCOPIC CHOLECYSTECTOMY WITH INTRAOPERATIVE CHOLANGIOGRAM (N/A)  Patient location during evaluation: PACU Anesthesia Type: General Level of consciousness: awake and alert, oriented and patient cooperative Pain management: pain level controlled Vital Signs Assessment: post-procedure vital signs reviewed and stable Respiratory status: spontaneous breathing, nonlabored ventilation and respiratory function stable Cardiovascular status: blood pressure returned to baseline and stable Postop Assessment: no signs of nausea or vomiting Anesthetic complications: no    Last Vitals:  Filed Vitals:   01/12/16 0945 01/12/16 1000  BP:  134/73  Pulse: 85 83  Temp:    Resp: 20 20    Last Pain:  Filed Vitals:   01/12/16 1002  PainSc: Asleep                 Tramayne Sebesta,E. Buffi Ewton

## 2016-01-12 NOTE — Anesthesia Procedure Notes (Signed)
Procedure Name: Intubation Date/Time: 01/12/2016 7:42 AM Performed by: Dairl PonderJIANG, Erinn Huskins Pre-anesthesia Checklist: Patient identified, Timeout performed, Emergency Drugs available, Suction available and Patient being monitored Patient Re-evaluated:Patient Re-evaluated prior to inductionOxygen Delivery Method: Circle system utilized Preoxygenation: Pre-oxygenation with 100% oxygen Intubation Type: IV induction, Rapid sequence and Cricoid Pressure applied Laryngoscope Size: Mac and 3 Grade View: Grade I Tube type: Oral Tube size: 7.0 mm Number of attempts: 1 Airway Equipment and Method: Stylet Placement Confirmation: ETT inserted through vocal cords under direct vision,  positive ETCO2 and breath sounds checked- equal and bilateral Secured at: 22 cm Tube secured with: Tape Dental Injury: Teeth and Oropharynx as per pre-operative assessment

## 2016-01-12 NOTE — Interval H&P Note (Signed)
History and Physical Interval Note:  01/12/2016 7:00 AM  Angel Gonzalez  has presented today for surgery, with the diagnosis of acute cholecystitis  The various methods of treatment have been discussed with the patient and family. After consideration of risks, benefits and other options for treatment, the patient has consented to  Procedure(s): LAPAROSCOPIC CHOLECYSTECTOMY WITH INTRAOPERATIVE CHOLANGIOGRAM (N/A) as a surgical intervention .  The patient's history has been reviewed, patient examined, no change in status, stable for surgery.  I have reviewed the patient's chart and labs.  Questions were answered to the patient's satisfaction.     Dason Mosley A.

## 2016-01-12 NOTE — Op Note (Signed)
Laparoscopic Cholecystectomy with IOC Procedure Note  Indications: This patient presents with symptomatic gallbladder disease and will undergo laparoscopic cholecystectomy. Patient was admitted for acute cholecystitis. She has been seen are charted and reviewed. I discussed the procedure with her at the bedside this morning.The procedure has been discussed with the patient. Operative and non operative treatments have been discussed. Risks of surgery include bleeding, infection,  Common bile duct injury,  Injury to the stomach,liver, colon,small intestine, abdominal wall,  Diaphragm,  Major blood vessels,  And the need for an open procedure.  Other risks include worsening of medical problems, death,  DVT and pulmonary embolism, and cardiovascular events.   Medical options have also been discussed. The patient has been informed of long term expectations of surgery and non surgical options,  The patient agrees to proceed.    Pre-operative Diagnosis: Acute cholecystitis  Post-operative Diagnosis: Gangrenous cholecystitis with abscess  Surgeon: Stelios Kirby A.   Assistants: None  Anesthesia: General endotracheal anesthesia and Local anesthesia 0.25.% bupivacaine, with epinephrine  ASA Class: 3  Procedure Details  The patient was seen again in the Holding Room. The risks, benefits, complications, treatment options, and expected outcomes were discussed with the patient. The possibilities of reaction to medication, pulmonary aspiration, perforation of viscus, bleeding, recurrent infection, finding a normal gallbladder, the need for additional procedures, failure to diagnose a condition, the possible need to convert to an open procedure, and creating a complication requiring transfusion or operation were discussed with the patient. The patient and/or family concurred with the proposed plan, giving informed consent. The site of surgery properly noted/marked. The patient was taken to Operating Room,  identified as Randall AnNadine L Moxey and the procedure verified as Laparoscopic Cholecystectomy with Intraoperative Cholangiograms. A Time Out was held and the above information confirmed.  Prior to the induction of general anesthesia, antibiotic prophylaxis was administered. General endotracheal anesthesia was then administered and tolerated well. After the induction, the abdomen was prepped in the usual sterile fashion. The patient was positioned in the supine position with the left arm comfortably tucked, along with some reverse Trendelenburg.  Local anesthetic agent was injected into the skin near the umbilicus and an incision made. The midline fascia was incised and the Hasson technique was used to introduce a 12 mm port under direct vision. It was secured with a figure of eight Vicryl suture placed in the usual fashion. Pneumoperitoneum was then created with CO2 and tolerated well without any adverse changes in the patient's vital signs. Additional trocars were introduced under direct vision with an 11 mm trocar in the epigastrium and 2 5 mm trocars in the right upper quadrant. All skin incisions were infiltrated with a local anesthetic agent before making the incision and placing the trocars. The patient had significant omentum adherent to the gallbladder. This is a, there is a large abscess and associated gangrenous cholecystitis and this is drained with suction.  The gallbladder was identified, the fundus grasped and retracted cephalad. Adhesions were lysed bluntly and with the electrocautery where indicated, taking care not to injure any adjacent organs or viscus. The infundibulum was grasped and retracted laterally, exposing the peritoneum overlying the triangle of Calot. This was then divided and exposed in a blunt fashion. The cystic duct was clearly identified and bluntly dissected circumferentially. The junctions of the gallbladder, cystic duct and common bile duct were clearly identified prior to the  division of any linear structure.   An incision was made in the cystic duct and the cholangiogram catheter  introduced. The catheter was secured using an endoclip. The study showed no stones and good visualization of the distal and proximal biliary tree. The catheter was then removed.   The cystic duct was then  ligated with surgical clips  on the patient side and  clipped on the gallbladder side and divided. The cystic artery was identified, dissected free, ligated with clips and divided as well. Posterior cystic artery clipped and divided. The gallbladder was necrotic. Copious amounts of irrigation was used.  The gallbladder was dissected from the liver bed in retrograde fashion with the electrocautery. The gallbladder was removed and placed into an Endo Catch bag. This was eventually extracted through the umbilicus and passed off the field. The liver bed was irrigated and inspected. Hemostasis was achieved with the electrocautery and Surgicel snow. It the most lateral port site 19 round drain was placed in the gallbladder fossa and secured to the skin with 2-0 nylon. Copious irrigation was utilized and was repeatedly aspirated until clear all particulate matter. Hemostasis was achieved with no signs  Of bleeding or bile leakage.  Pneumoperitoneum was completely reduced after viewing removal of the trocars under direct vision. The wound was thoroughly irrigated and the fascia was then closed with a figure of eight suture; the skin was then closed with 4-0 Monocryl and a sterile dressing  using liquid adhesive was applied.  Instrument, sponge, and needle counts were correct at closure and at the conclusion of the case.   Findings: Cholecystitis with Cholelithiasis  Estimated Blood Loss: less than 100 mL         Drains: 19 French         Total IV Fluids: 1000 mL         Specimens: Gallbladder           Complications: None; patient tolerated the procedure well.         Disposition: PACU -  hemodynamically stable.         Condition: stable

## 2016-01-12 NOTE — Progress Notes (Signed)
Temp elevated, OR  Report noted, abscess present.  Pt is on IV antibiotic already. IS given and instructed, demos to 500, will continue to encourage IS.

## 2016-01-12 NOTE — H&P (View-Only) (Signed)
Reason for Consult:acute cholecystitis  Referring Physician: Dr. Manson Passey   HPI: Angel Gonzalez is a 70 year old female with a history of HTN, HLD and diabetes who was admitted from her PCPs office with abdominal pain, nausea and vomiting.  The patient was seen at [facility] on the 21st at which time she had a CT scan which showed a stone in the neck of the gallbladder.  She was discharged home to follow up with her pcp.  Duration of symptoms is 5 days.  Onset was sudden.  Coarse is unchanged.  Location is RUQ with radiation to the back.  Symptoms are constant.  Aggravated by oral intake.  No alleviating factors.  Associated with anorexia, malaise, chills and sweats.  She has not been able to keep very much down since last Sunday.  She was seen by per PCPs office and referred for an admission. Initial work up reveals, WBC 15.7k, k 2.8, 2Cr 2.3, normal LFTs, normal lipase, troponin .11, .03 and .04.  Abdominal US revealed gallstones, wall thickening and pericholecystic fluid.  We have therefore asked to evaluate.  She usually walks 2-3 miles per day.  Denies any shortness of breath, chest pressure or pain.  Does not use anticoagulation.  PSH include a tubal ligation.  Last oral intake was yesterday afternoon.   Past Medical History  Diagnosis Date  . Hypertension   . Diabetes mellitus without complication (HCC)     History reviewed. No pertinent past surgical history.  Tubal ligation Breast biopsy   Family History  Problem Relation Age of Onset  . Diabetes type II Other   . Hypertension Other     Social History:  reports that she has never smoked. She does not have any smokeless tobacco history on file. She reports that she does not drink alcohol or use illicit drugs.  Allergies:  Allergies  Allergen Reactions  . Penicillins Hives    Medications:  Scheduled Meds: . cefTRIAXone (ROCEPHIN)  IV  2 g Intravenous Q24H  . heparin  5,000 Units Subcutaneous Q8H  . insulin aspart  0-9 Units  Subcutaneous TID WC  . pravastatin  20 mg Oral q1800  . sodium chloride flush  3 mL Intravenous Q12H   Continuous Infusions:  PRN Meds:.sodium chloride, acetaminophen **OR** acetaminophen, bisacodyl, HYDROcodone-acetaminophen, HYDROmorphone (DILAUDID) injection, ondansetron **OR** ondansetron (ZOFRAN) IV, polyethylene glycol, sodium chloride flush   Results for orders placed or performed during the hospital encounter of 01/10/16 (from the past 48 hour(s))  Comprehensive metabolic panel     Status: Abnormal   Collection Time: 01/10/16 10:19 PM  Result Value Ref Range   Sodium 134 (L) 135 - 145 mmol/L   Potassium 2.8 (L) 3.5 - 5.1 mmol/L   Chloride 98 (L) 101 - 111 mmol/L   CO2 28 22 - 32 mmol/L   Glucose, Bld 130 (H) 65 - 99 mg/dL   BUN 39 (H) 6 - 20 mg/dL   Creatinine, Ser 9.08 (H) 0.44 - 1.00 mg/dL   Calcium 8.7 (L) 8.9 - 10.3 mg/dL   Total Protein 6.8 6.5 - 8.1 g/dL   Albumin 2.6 (L) 3.5 - 5.0 g/dL   AST 33 15 - 41 U/L   ALT 27 14 - 54 U/L   Alkaline Phosphatase 86 38 - 126 U/L   Total Bilirubin 0.7 0.3 - 1.2 mg/dL   GFR calc non Af Amer 20 (L) >60 mL/min   GFR calc Af Amer 24 (L) >60 mL/min    Comment: (NOTE) The  eGFR has been calculated using the CKD EPI equation. This calculation has not been validated in all clinical situations. eGFR's persistently <60 mL/min signify possible Chronic Kidney Disease.    Anion gap 8 5 - 15  Magnesium     Status: None   Collection Time: 01/10/16 10:19 PM  Result Value Ref Range   Magnesium 2.1 1.7 - 2.4 mg/dL  CBC WITH DIFFERENTIAL     Status: Abnormal   Collection Time: 01/10/16 10:19 PM  Result Value Ref Range   WBC 15.7 (H) 4.0 - 10.5 K/uL   RBC 4.18 3.87 - 5.11 MIL/uL   Hemoglobin 12.9 12.0 - 15.0 g/dL   HCT 38.7 36.0 - 46.0 %   MCV 92.6 78.0 - 100.0 fL   MCH 30.9 26.0 - 34.0 pg   MCHC 33.3 30.0 - 36.0 g/dL   RDW 13.0 11.5 - 15.5 %   Platelets 186 150 - 400 K/uL   Neutrophils Relative % 75 %   Neutro Abs 11.7 (H) 1.7 - 7.7  K/uL   Lymphocytes Relative 12 %   Lymphs Abs 1.9 0.7 - 4.0 K/uL   Monocytes Relative 13 %   Monocytes Absolute 2.1 (H) 0.1 - 1.0 K/uL   Eosinophils Relative 0 %   Eosinophils Absolute 0.0 0.0 - 0.7 K/uL   Basophils Relative 0 %   Basophils Absolute 0.0 0.0 - 0.1 K/uL  APTT     Status: None   Collection Time: 01/10/16 10:19 PM  Result Value Ref Range   aPTT 31 24 - 37 seconds  Protime-INR     Status: Abnormal   Collection Time: 01/10/16 10:19 PM  Result Value Ref Range   Prothrombin Time 15.6 (H) 11.6 - 15.2 seconds   INR 1.22 0.00 - 1.49  Lactic acid, plasma     Status: None   Collection Time: 01/10/16 10:19 PM  Result Value Ref Range   Lactic Acid, Venous 1.1 0.5 - 2.0 mmol/L  Procalcitonin - Baseline     Status: None   Collection Time: 01/10/16 10:19 PM  Result Value Ref Range   Procalcitonin 3.58 ng/mL    Comment:        Interpretation: PCT > 2 ng/mL: Systemic infection (sepsis) is likely, unless other causes are known. (NOTE)         ICU PCT Algorithm               Non ICU PCT Algorithm    ----------------------------     ------------------------------         PCT < 0.25 ng/mL                 PCT < 0.1 ng/mL     Stopping of antibiotics            Stopping of antibiotics       strongly encouraged.               strongly encouraged.    ----------------------------     ------------------------------       PCT level decrease by               PCT < 0.25 ng/mL       >= 80% from peak PCT       OR PCT 0.25 - 0.5 ng/mL          Stopping of antibiotics  encouraged.     Stopping of antibiotics           encouraged.    ----------------------------     ------------------------------       PCT level decrease by              PCT >= 0.25 ng/mL       < 80% from peak PCT        AND PCT >= 0.5 ng/mL            Continuing antibiotics                                               encouraged.       Continuing antibiotics             encouraged.    ----------------------------     ------------------------------     PCT level increase compared          PCT > 0.5 ng/mL         with peak PCT AND          PCT >= 0.5 ng/mL             Escalation of antibiotics                                          strongly encouraged.      Escalation of antibiotics        strongly encouraged.   Lipase, blood     Status: None   Collection Time: 01/10/16 10:19 PM  Result Value Ref Range   Lipase 20 11 - 51 U/L  Amylase     Status: None   Collection Time: 01/10/16 10:19 PM  Result Value Ref Range   Amylase 46 28 - 100 U/L  Troponin I     Status: Abnormal   Collection Time: 01/10/16 10:19 PM  Result Value Ref Range   Troponin I 0.11 (H) <0.031 ng/mL    Comment:        PERSISTENTLY INCREASED TROPONIN VALUES IN THE RANGE OF 0.04-0.49 ng/mL CAN BE SEEN IN:       -UNSTABLE ANGINA       -CONGESTIVE HEART FAILURE       -MYOCARDITIS       -CHEST TRAUMA       -ARRYHTHMIAS       -LATE PRESENTING MYOCARDIAL INFARCTION       -COPD   CLINICAL FOLLOW-UP RECOMMENDED.   Glucose, capillary     Status: Abnormal   Collection Time: 01/10/16 11:03 PM  Result Value Ref Range   Glucose-Capillary 102 (H) 65 - 99 mg/dL  Urinalysis, Routine w reflex microscopic (not at [facility])     Status: Abnormal   Collection Time: 01/10/16 11:53 PM  Result Value Ref Range   Color, Urine YELLOW YELLOW   APPearance CLEAR CLEAR   Specific Gravity, Urine 1.026 1.005 - 1.030   pH 5.5 5.0 - 8.0   Glucose, UA >1000 (A) NEGATIVE mg/dL   Hgb urine dipstick NEGATIVE NEGATIVE   Bilirubin Urine NEGATIVE NEGATIVE   Ketones, ur NEGATIVE NEGATIVE mg/dL   Protein, ur NEGATIVE NEGATIVE mg/dL   Nitrite NEGATIVE NEGATIVE   Leukocytes, UA NEGATIVE NEGATIVE  Urine microscopic-add on  Status: Abnormal   Collection Time: 01/10/16 11:53 PM  Result Value Ref Range   Squamous Epithelial / LPF 0-5 (A) NONE SEEN   WBC, UA NONE SEEN 0 - 5 WBC/hpf   RBC / HPF NONE SEEN 0 - 5  RBC/hpf   Bacteria, UA RARE (A) NONE SEEN  Lactic acid, plasma     Status: None   Collection Time: 01/11/16 12:30 AM  Result Value Ref Range   Lactic Acid, Venous 0.9 0.5 - 2.0 mmol/L  Troponin I (q 6hr x 3)     Status: None   Collection Time: 01/11/16 12:30 AM  Result Value Ref Range   Troponin I 0.03 <0.031 ng/mL    Comment:        NO INDICATION OF MYOCARDIAL INJURY.   Glucose, capillary     Status: Abnormal   Collection Time: 01/11/16  8:17 AM  Result Value Ref Range   Glucose-Capillary 128 (H) 65 - 99 mg/dL  Troponin I (q 6hr x 3)     Status: Abnormal   Collection Time: 01/11/16  8:27 AM  Result Value Ref Range   Troponin I 0.04 (H) <0.031 ng/mL    Comment:        PERSISTENTLY INCREASED TROPONIN VALUES IN THE RANGE OF 0.04-0.49 ng/mL CAN BE SEEN IN:       -UNSTABLE ANGINA       -CONGESTIVE HEART FAILURE       -MYOCARDITIS       -CHEST TRAUMA       -ARRYHTHMIAS       -LATE PRESENTING MYOCARDIAL INFARCTION       -COPD   CLINICAL FOLLOW-UP RECOMMENDED.   Basic metabolic panel     Status: Abnormal   Collection Time: 01/11/16  8:27 AM  Result Value Ref Range   Sodium 137 135 - 145 mmol/L   Potassium 3.6 3.5 - 5.1 mmol/L   Chloride 102 101 - 111 mmol/L   CO2 25 22 - 32 mmol/L   Glucose, Bld 134 (H) 65 - 99 mg/dL   BUN 32 (H) 6 - 20 mg/dL   Creatinine, Ser 1.98 (H) 0.44 - 1.00 mg/dL   Calcium 8.7 (L) 8.9 - 10.3 mg/dL   GFR calc non Af Amer 24 (L) >60 mL/min   GFR calc Af Amer 28 (L) >60 mL/min    Comment: (NOTE) The eGFR has been calculated using the CKD EPI equation. This calculation has not been validated in all clinical situations. eGFR's persistently <60 mL/min signify possible Chronic Kidney Disease.    Anion gap 10 5 - 15    US Abdomen Complete  01/11/2016  CLINICAL DATA:  Biliary colic followup EXAM: ABDOMEN ULTRASOUND COMPLETE COMPARISON:  01/06/2016. FINDINGS: Gallbladder: Diffuse gallbladder wall thickening is noted which measures up to 9 mm. Multiple  stones are identified within the gallbladder as well as gallbladder sludge. The stones measure up to 11 mm. Pericholecystic fluid is noted. Common bile duct: Diameter: 8.7 mm Liver: The liver has a heterogeneous echotexture. IVC: No abnormality visualized. Pancreas: Visualized portion unremarkable. Spleen: Size and appearance within normal limits. Right Kidney: Length: 11.7 cm. Perinephric fluid noted. Echogenicity within normal limits. No mass or hydronephrosis visualized. Left Kidney: Length: 10.4 cm. Echogenicity within normal limits. No mass or hydronephrosis visualized. Abdominal aorta: No aneurysm visualized. Other findings: Left pleural effusion noted. IMPRESSION: 1. Gallstones, gallbladder sludge and diffuse gallbladder wall thickening and pericholecystic fluid compatible with acute cholecystitis. 2. Hepatic steatosis suspected. 3. Left pleural effusion 4.  Right perinephric fluid noted without hydronephrosis. Electronically Signed   By: Kerby Moors M.D.   On: 01/11/2016 08:19    Review of Systems  Constitutional: Positive for fever, chills and malaise/fatigue. Negative for weight loss and diaphoresis.  Eyes: Negative for blurred vision, double vision, photophobia, pain, discharge and redness.  Respiratory: Negative for cough, hemoptysis, sputum production, shortness of breath and wheezing.   Cardiovascular: Negative for chest pain, palpitations, orthopnea, claudication, leg swelling and PND.  Gastrointestinal: Positive for nausea, vomiting and abdominal pain. Negative for diarrhea, constipation, blood in stool and melena.  Genitourinary: Negative for dysuria, urgency, frequency, hematuria and flank pain.  Musculoskeletal: Negative for myalgias, back pain, joint pain, falls and neck pain.  Neurological: Negative for dizziness, tingling, tremors, sensory change, speech change, focal weakness, seizures, loss of consciousness, weakness and headaches.  Psychiatric/Behavioral: Negative for suicidal  ideas, memory loss and substance abuse.   Blood pressure 112/54, pulse 60, temperature 98.6 F (37 C), temperature source Oral, resp. rate 18, height '5\' 3"'$  (1.6 m), weight 74.39 kg (164 lb), SpO2 95 %. Physical Exam  Constitutional: She is oriented to person, place, and time. She appears well-developed and well-nourished. No distress.  HENT:  Head: Normocephalic and atraumatic.  Mouth/Throat: No oropharyngeal exudate.  Eyes: No scleral icterus.  Cardiovascular: Normal rate, regular rhythm, normal heart sounds and intact distal pulses.  Exam reveals no gallop and no friction rub.   No murmur heard. Respiratory: Effort normal and breath sounds normal. No respiratory distress. She has no wheezes. She has no rales. She exhibits no tenderness.  GI: Soft. Bowel sounds are normal. She exhibits no distension and no mass. There is no rebound and no guarding.  TTP RUQ  Musculoskeletal: Normal range of motion. She exhibits no edema or tenderness.  Neurological: She is alert and oriented to person, place, and time.  Skin: Skin is dry. No rash noted. She is not diaphoretic. No erythema. No pallor.  Psychiatric: She has a normal mood and affect. Her behavior is normal. Judgment and thought content normal.    Assessment/Plan: Acute calculous cholecystitis-recommend a laparoscopic cholecystectomy if echocardiogram okay/cleared by cardiology.  Surgical risks discussed including infection, bleeding, injury to surrounding structures, open surgery, heart attack, DVT/PE, stroke and respiratory compromise the patient verbalizes understanding and wishes to proceed.  Timing will depend on cardiac clearance and then OR availability.  Will keep her NPO for now in case we can do her later today. AKI-improving, IVF DM II-SSI CBGs CV-htn, troponin elevation  ID-rocephin FEN-NPO, IVF  Angel Gonzalez ANP-BC Pager [phone] 01/11/2016, 11:01 AM

## 2016-01-13 DIAGNOSIS — K810 Acute cholecystitis: Secondary | ICD-10-CM | POA: Diagnosis not present

## 2016-01-13 DIAGNOSIS — I10 Essential (primary) hypertension: Secondary | ICD-10-CM | POA: Diagnosis present

## 2016-01-13 DIAGNOSIS — Z7984 Long term (current) use of oral hypoglycemic drugs: Secondary | ICD-10-CM | POA: Diagnosis not present

## 2016-01-13 DIAGNOSIS — J90 Pleural effusion, not elsewhere classified: Secondary | ICD-10-CM | POA: Diagnosis not present

## 2016-01-13 DIAGNOSIS — Z87891 Personal history of nicotine dependence: Secondary | ICD-10-CM | POA: Diagnosis not present

## 2016-01-13 DIAGNOSIS — G8929 Other chronic pain: Secondary | ICD-10-CM | POA: Diagnosis present

## 2016-01-13 DIAGNOSIS — I248 Other forms of acute ischemic heart disease: Secondary | ICD-10-CM | POA: Diagnosis present

## 2016-01-13 DIAGNOSIS — K8050 Calculus of bile duct without cholangitis or cholecystitis without obstruction: Secondary | ICD-10-CM | POA: Diagnosis not present

## 2016-01-13 DIAGNOSIS — D72829 Elevated white blood cell count, unspecified: Secondary | ICD-10-CM | POA: Diagnosis not present

## 2016-01-13 DIAGNOSIS — R1011 Right upper quadrant pain: Secondary | ICD-10-CM | POA: Diagnosis present

## 2016-01-13 DIAGNOSIS — E119 Type 2 diabetes mellitus without complications: Secondary | ICD-10-CM | POA: Diagnosis present

## 2016-01-13 DIAGNOSIS — K8000 Calculus of gallbladder with acute cholecystitis without obstruction: Secondary | ICD-10-CM | POA: Diagnosis present

## 2016-01-13 DIAGNOSIS — E876 Hypokalemia: Secondary | ICD-10-CM | POA: Diagnosis present

## 2016-01-13 DIAGNOSIS — R188 Other ascites: Secondary | ICD-10-CM | POA: Diagnosis present

## 2016-01-13 DIAGNOSIS — Z880 Allergy status to penicillin: Secondary | ICD-10-CM | POA: Diagnosis not present

## 2016-01-13 DIAGNOSIS — R7881 Bacteremia: Secondary | ICD-10-CM | POA: Diagnosis not present

## 2016-01-13 DIAGNOSIS — E669 Obesity, unspecified: Secondary | ICD-10-CM | POA: Diagnosis present

## 2016-01-13 DIAGNOSIS — J9811 Atelectasis: Secondary | ICD-10-CM | POA: Diagnosis not present

## 2016-01-13 DIAGNOSIS — E785 Hyperlipidemia, unspecified: Secondary | ICD-10-CM | POA: Diagnosis present

## 2016-01-13 DIAGNOSIS — Z79899 Other long term (current) drug therapy: Secondary | ICD-10-CM | POA: Diagnosis not present

## 2016-01-13 DIAGNOSIS — Z6829 Body mass index (BMI) 29.0-29.9, adult: Secondary | ICD-10-CM | POA: Diagnosis not present

## 2016-01-13 DIAGNOSIS — M545 Low back pain: Secondary | ICD-10-CM | POA: Diagnosis present

## 2016-01-13 DIAGNOSIS — N179 Acute kidney failure, unspecified: Secondary | ICD-10-CM | POA: Diagnosis present

## 2016-01-13 DIAGNOSIS — B9620 Unspecified Escherichia coli [E. coli] as the cause of diseases classified elsewhere: Secondary | ICD-10-CM | POA: Diagnosis present

## 2016-01-13 LAB — BASIC METABOLIC PANEL
Anion gap: 8 (ref 5–15)
BUN: 21 mg/dL — ABNORMAL HIGH (ref 6–20)
CO2: 26 mmol/L (ref 22–32)
CREATININE: 1.49 mg/dL — AB (ref 0.44–1.00)
Calcium: 8.7 mg/dL — ABNORMAL LOW (ref 8.9–10.3)
Chloride: 108 mmol/L (ref 101–111)
GFR calc Af Amer: 40 mL/min — ABNORMAL LOW (ref >60)
GFR calc non Af Amer: 34 mL/min — ABNORMAL LOW (ref >60)
Glucose, Bld: 119 mg/dL — ABNORMAL HIGH (ref 65–99)
Potassium: 3.8 mmol/L (ref 3.5–5.1)
Sodium: 142 mmol/L (ref 135–145)

## 2016-01-13 LAB — CULTURE, BLOOD (ROUTINE X 2)

## 2016-01-13 LAB — CBC
HCT: 37.6 % (ref 36.0–46.0)
HEMOGLOBIN: 12.2 g/dL (ref 12.0–15.0)
MCH: 30.5 pg (ref 26.0–34.0)
MCHC: 32.4 g/dL (ref 30.0–36.0)
MCV: 94 fL (ref 78.0–100.0)
PLATELETS: 241 10*3/uL (ref 150–400)
RBC: 4 MIL/uL (ref 3.87–5.11)
RDW: 13.3 % (ref 11.5–15.5)
WBC: 18.6 10*3/uL — AB (ref 4.0–10.5)

## 2016-01-13 LAB — GLUCOSE, CAPILLARY
GLUCOSE-CAPILLARY: 160 mg/dL — AB (ref 65–99)
Glucose-Capillary: 103 mg/dL — ABNORMAL HIGH (ref 65–99)
Glucose-Capillary: 147 mg/dL — ABNORMAL HIGH (ref 65–99)
Glucose-Capillary: 115 mg/dL — ABNORMAL HIGH (ref 65–99)

## 2016-01-13 MED ORDER — ASPIRIN EC 81 MG PO TBEC
81.0000 mg | DELAYED_RELEASE_TABLET | Freq: Every day | ORAL | Status: DC
  Administered 2016-01-14 – 2016-01-19 (×6): 81 mg via ORAL
  Filled 2016-01-13 (×6): qty 1

## 2016-01-13 NOTE — Consult Note (Signed)
Late entry Ref: [facility]   Subjective:  Sore post surgery for acute cholecystitis. Minimal Troponin-I elevation.  Objective:  Vital Signs in the last 24 hours: Temp:  [97.6 F (36.4 C)-101.9 F (38.8 C)] 98.8 F (37.1 C) (05/28 0611) Pulse Rate:  [35-88] 87 (05/28 0611) Cardiac Rhythm:  [-] Normal sinus rhythm (05/27 1045) Resp:  [17-26] 17 (05/28 0611) BP: (108-146)/(61-85) 146/73 mmHg (05/28 0611) SpO2:  [91 %-100 %] 95 % (05/28 1610)  Physical Exam: BP Readings from Last 1 Encounters:  01/13/16 146/73    Wt Readings from Last 1 Encounters:  01/10/16 74.39 kg (164 lb)    Weight change:   HEENT: [place]/AT, Eyes-Brown, PERL, EOMI, Conjunctiva-Pink, Sclera-Non-icteric Neck: No JVD, No bruit, Trachea midline. Lungs:  Clear, Bilateral. Cardiac:  Regular rhythm, normal S1 and S2, no S3.  Abdomen:  Soft, RUQ-tenderness. Extremities:  No edema present. No cyanosis. No clubbing. CNS: AxOx3, Cranial nerves grossly intact, moves all 4 extremities. Right handed. Skin: Warm and dry.   Intake/Output from previous day: 05/27 0701 - 05/28 0700 In: 2961 [P.O.:120; I.V.:2841] Out: 1625 [Urine:1400; Drains:125; Blood:100]    Lab Results: BMET    Component Value Date/Time   NA 137 01/11/2016 0827   NA 134* 01/10/2016 2219   NA 145 01/06/2016 1854   K 3.6 01/11/2016 0827   K 2.8* 01/10/2016 2219   K 3.2* 01/06/2016 1854   CL 102 01/11/2016 0827   CL 98* 01/10/2016 2219   CL 104 01/06/2016 1854   CO2 25 01/11/2016 0827   CO2 28 01/10/2016 2219   CO2 27 01/06/2016 1815   GLUCOSE 134* 01/11/2016 0827   GLUCOSE 130* 01/10/2016 2219   GLUCOSE 120* 01/06/2016 1854   BUN 32* 01/11/2016 0827   BUN 39* 01/10/2016 2219   BUN 33* 01/06/2016 1854   CREATININE 1.98* 01/11/2016 0827   CREATININE 2.30* 01/10/2016 2219   CREATININE 1.40* 01/06/2016 1854   CALCIUM 8.7* 01/11/2016 0827   CALCIUM 8.7* 01/10/2016 2219   CALCIUM 9.4 01/06/2016 1815   GFRNONAA 24*  01/11/2016 0827   GFRNONAA 20* 01/10/2016 2219   GFRNONAA 36* 01/06/2016 1815   GFRAA 28* 01/11/2016 0827   GFRAA 24* 01/10/2016 2219   GFRAA 42* 01/06/2016 1815   CBC    Component Value Date/Time   WBC 15.7* 01/10/2016 2219   RBC 4.18 01/10/2016 2219   HGB 12.9 01/10/2016 2219   HCT 38.7 01/10/2016 2219   PLT 186 01/10/2016 2219   MCV 92.6 01/10/2016 2219   MCH 30.9 01/10/2016 2219   MCHC 33.3 01/10/2016 2219   RDW 13.0 01/10/2016 2219   LYMPHSABS 1.9 01/10/2016 2219   MONOABS 2.1* 01/10/2016 2219   EOSABS 0.0 01/10/2016 2219   BASOSABS 0.0 01/10/2016 2219   HEPATIC Function Panel  Recent Labs  01/06/16 1815 01/10/16 2219 01/11/16 1119  PROT 8.4* 6.8 6.8   HEMOGLOBIN A1C No components found for: HGA1C,  MPG CARDIAC ENZYMES Lab Results  Component Value Date   TROPONINI 0.06* 01/11/2016   TROPONINI 0.04* 01/11/2016   TROPONINI 0.03 01/11/2016   BNP No results for input(s): PROBNP in the last 8760 hours. TSH No results for input(s): TSH in the last 8760 hours. CHOLESTEROL No results for input(s): CHOL in the last 8760 hours.  Scheduled Meds: . cefTRIAXone (ROCEPHIN)  IV  2 g Intravenous Q24H  . heparin  5,000 Units Subcutaneous Q8H  . insulin aspart  0-9 Units Subcutaneous TID WC  . metoprolol succinate  25 mg  Oral Daily  . pravastatin  20 mg Oral q1800   Continuous Infusions: . sodium chloride 75 mL/hr at 01/13/16 0630   PRN Meds:.sodium chloride, acetaminophen **OR** acetaminophen, alum & mag hydroxide-simeth, bisacodyl, HYDROcodone-acetaminophen, HYDROmorphone (DILAUDID) injection, ondansetron **OR** ondansetron (ZOFRAN) IV, polyethylene glycol  Assessment/Plan: Acute cholecystitis S/P Surgery Hypertension DM, II Abnormal troponin-I, minimal and possible non-cardiac source   Continue medical treatment.   LOS: 2 days    Orpah CobbAjay Maveryck Bahri  MD  01/13/2016, 9:19 AM

## 2016-01-13 NOTE — Progress Notes (Signed)
Patient ID: Angel Gonzalez, female   DOB: [birth year], 70 y.o.   MRN: [id number]  PROGRESS NOTE    ZAILEY AUDIA  WUJ:[id number] DOB: [birth year] DOA: 01/10/2016  PCP: [facility]   Brief Narrative:  70 year-old female with past medical history significant for hypertension, dyslipidemia, diabetes. Patient was sent from primary care office for evaluation of biliary colic. Patient started having right upper quadrant abdominal pain 01/06/2016 which was associated with nausea and vomiting and then it spontaneously resolved after 30 minutes. Since then however she continued to have recurrence of this pain with increased frequency and duration. She was not able to tolerate any by mouth intake because of nausea and vomiting. She has been seen in [facility] emergency department 01/06/2016 were CT scan demonstrated 12 mm stone in gallbladder neck but there was no evidence of acute cholecystitis so she was subsequently discharged home. Because of ongoing pain and nausea and vomiting she presented to primary care office and was told to come to [facility] for further evaluation.  Patient was hemodynamically stable on the admission. Blood work was notable for white blood cell count of 15.7, potassium 2.8, creatinine 2.30, normal lipase and normal LFTs. Her abdominal ultrasound significant for acute cholecystitis. Surgery has seen her in consultation. She is s/p lap cholecystectomy 5/27.   Assessment & Plan:   Principal Problem:   Biliary colic / Acute cholecystitis / leukocytosis - Abdominal ultrasound significant for acute cholecystitis - Normal liver function enzymes, normal lipase - S/P lap cholecystectomy 5/27  - Drain in place - We will continue Rocephin - Diet per surgery, advanced to solid  Active Problems:   E.Coli and Enterobacteriaceae  bacteremia - Blood cultures on admission growing E.Coli and Enterobacteriaceae species - Repeat blood cultures today to ensure clearance of  bacteremia - Continue Rocephin for now per sensitivity report  -Check CBC today     Mild troponin elevation  - Likely secondary to demand ischemia in the setting of acute cholecystitis - Troponin 0.11, 0.03, 0.04 - No acute ischemic changes on admission 12-lead EKG - 2 D ECHO with normal EF - Seen by cardiology in consultation    Essential hypertension - Continue metoprolol 25 mg daily    Hyperlipidemia - Continue Pravachol    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 (HCC) - A1c is 6.4 - CBGs in past 24 hours: 178, 115, 103 - The sliding scale insulin    Hypokalemia - Likely due to GI losses - Supplemented - Check BMP today    Acute kidney injury - Creatinine 2.3, improving with hydration  - Cr on 5/21 was 1.4 but no other values for comparison  - BMP is pending this morning    DVT prophylaxis: Heparin subcutaneous Code Status: full code  Family Communication: Daughter at the bedside Disposition Plan: Anticipate discharge once cleared by surgery    Consultants:   Surgery  Cardiology   Procedures:   Lap chole 01/12/2016   2 D ECHO 01/11/2016 - normal EF, grade 1 DD  Antimicrobials:   Rocephin 01/10/2016 -->   Subjective: No overnight events. Feels okay only slight pain in abd.  Objective: Filed Vitals:   01/12/16 1806 01/12/16 2224 01/13/16 0222 01/13/16 0611  BP: 108/61 127/64 132/65 146/73  Pulse: 88 87 77 87  Temp: 99 F (37.2 C) 97.9 F (36.6 C) 97.7 F (36.5 C) 98.8 F (37.1 C)  TempSrc: Oral Oral Oral Oral  Resp:  18 18 17  Height:      Weight:      SpO2: 92% 91% 91% 95%    Intake/Output Summary (Last 24 hours) at 01/13/16 0830 Last data filed at 01/13/16 0700  Gross per 24 hour  Intake   2961 ml  Output   1625 ml  Net   1336 ml   Filed Weights   01/10/16 1905  Weight: 74.39 kg (164 lb)    Examination:  General exam: Appears calm and comfortable  Respiratory system: No wheezing, no  rhonchi Cardiovascular system: S1 & S2 appreciated , rate controlled Gastrointestinal system: (+) BS, slight pain around the drain site but no guarding Central nervous system: Nonfocal Extremities: No lower extremity swelling, appreciate pulses  Skin: No ulcers or lesions Psychiatry: Normal mood and behavior  Data Reviewed: I have personally reviewed following labs and imaging studies  CBC:  Recent Labs Lab 01/06/16 1815 01/06/16 1854 01/10/16 2219  WBC 9.5  --  15.7*  NEUTROABS 5.1  --  11.7*  HGB 14.9 17.3* 12.9  HCT 44.1 51.0* 38.7  MCV 94.2  --  92.6  PLT 201  --  186   Basic Metabolic Panel:  Recent Labs Lab 01/06/16 1815 01/06/16 1854 01/10/16 2219 01/11/16 0827  NA 139 145 134* 137  K 3.1* 3.2* 2.8* 3.6  CL 103 104 98* 102  CO2 27  --  28 25  GLUCOSE 120* 120* 130* 134*  BUN 28* 33* 39* 32*  CREATININE 1.44* 1.40* 2.30* 1.98*  CALCIUM 9.4  --  8.7* 8.7*  MG  --   --  2.1  --    GFR: Estimated Creatinine Clearance: 25.5 mL/min (by C-G formula based on Cr of 1.98). Liver Function Tests:  Recent Labs Lab 01/06/16 1815 01/10/16 2219 01/11/16 1119  AST 25 33 56*  ALT 18 27 42  ALKPHOS 82 86 103  BILITOT 0.5 0.7 0.8  PROT 8.4* 6.8 6.8  ALBUMIN 4.4 2.6* 2.5*    Recent Labs Lab 01/06/16 1815 01/10/16 2219  LIPASE 40 20  AMYLASE  --  46   No results for input(s): AMMONIA in the last 168 hours. Coagulation Profile:  Recent Labs Lab 01/10/16 2219  INR 1.22   Cardiac Enzymes:  Recent Labs Lab 01/10/16 2219 01/11/16 0030 01/11/16 0827 01/11/16 1152  TROPONINI 0.11* 0.03 0.04* 0.06*   BNP (last 3 results) No results for input(s): PROBNP in the last 8760 hours. HbA1C:  Recent Labs  01/10/16 2219  HGBA1C 6.4*   CBG:  Recent Labs Lab 01/12/16 0943 01/12/16 1150 01/12/16 1701 01/12/16 2031 01/13/16 0756  GLUCAP 115* 121* 178* 115* 103*   Lipid Profile: No results for input(s): CHOL, HDL, LDLCALC, TRIG, CHOLHDL, LDLDIRECT in  the last 72 hours. Thyroid Function Tests: No results for input(s): TSH, T4TOTAL, FREET4, T3FREE, THYROIDAB in the last 72 hours. Anemia Panel: No results for input(s): VITAMINB12, FOLATE, FERRITIN, TIBC, IRON, RETICCTPCT in the last 72 hours. Urine analysis:    Component Value Date/Time   COLORURINE YELLOW 01/10/2016 2353   APPEARANCEUR CLEAR 01/10/2016 2353   LABSPEC 1.026 01/10/2016 2353   PHURINE 5.5 01/10/2016 2353   GLUCOSEU >1000* 01/10/2016 2353   HGBUR NEGATIVE 01/10/2016 2353   BILIRUBINUR NEGATIVE 01/10/2016 2353   KETONESUR NEGATIVE 01/10/2016 2353   PROTEINUR NEGATIVE 01/10/2016 2353   NITRITE NEGATIVE 01/10/2016 2353   LEUKOCYTESUR NEGATIVE 01/10/2016 2353   Sepsis Labs: @LABRCNTIP (procalcitonin:4,lacticidven:4)   Culture, blood (routine x 2)     Status: Abnormal   Collection  Time: 01/10/16 11:20 PM  Result Value Ref Range Status   Specimen Description BLOOD LEFT ARM  Final   Special Requests BOTTLES DRAWN AEROBIC AND ANAEROBIC  Final   Culture  Setup Time   Final   Culture ESCHERICHIA COLI (A)  Final   Report Status 01/13/2016 FINAL  Final   Organism ID, Bacteria ESCHERICHIA COLI  Final      Susceptibility   Escherichia coli - MIC*    AMPICILLIN >=32 RESISTANT Resistant     CEFAZOLIN 8 SENSITIVE Sensitive     CEFEPIME <=1 SENSITIVE Sensitive     CEFTAZIDIME <=1 SENSITIVE Sensitive     CEFTRIAXONE <=1 SENSITIVE Sensitive     CIPROFLOXACIN <=0.25 SENSITIVE Sensitive     GENTAMICIN <=1 SENSITIVE Sensitive     IMIPENEM <=0.25 SENSITIVE Sensitive     TRIMETH/SULFA <=20 SENSITIVE Sensitive     AMPICILLIN/SULBACTAM 16 INTERMEDIATE Intermediate     PIP/TAZO <=4 SENSITIVE Sensitive     * ESCHERICHIA COLI  Blood Culture ID Panel (Reflexed)     Status: Abnormal   Collection Time: 01/10/16 11:20 PM  Result Value Ref Range Status   Enterococcus species NOT DETECTED NOT DETECTED Final   Vancomycin resistance NOT DETECTED NOT DETECTED Final   Listeria  monocytogenes NOT DETECTED NOT DETECTED Final   Staphylococcus species NOT DETECTED NOT DETECTED Final   Staphylococcus aureus NOT DETECTED NOT DETECTED Final   Methicillin resistance NOT DETECTED NOT DETECTED Final   Streptococcus species NOT DETECTED NOT DETECTED Final   Streptococcus agalactiae NOT DETECTED NOT DETECTED Final   Streptococcus pneumoniae NOT DETECTED NOT DETECTED Final   Streptococcus pyogenes NOT DETECTED NOT DETECTED Final   Acinetobacter baumannii NOT DETECTED NOT DETECTED Final   Enterobacteriaceae specie DETECTED (A) NOT DETECTED Final   Enterobacter cloacae comple NOT DETECTED NOT DETECTED Final   Escherichia coli DETECTED (A) NOT DETECTED Final   Klebsiella oxytoca NOT DETECTED NOT DETECTED Final   Klebsiella pneumoniae NOT DETECTED NOT DETECTED Final   Proteus species NOT DETECTED NOT DETECTED Final   Serratia marcescens NOT DETECTED NOT DETECTED Final   Carbapenem resistance NOT DETECTED NOT DETECTED Final   Haemophilus influenzae NOT DETECTED NOT DETECTED Final   Neisseria meningitidis NOT DETECTED NOT DETECTED Final   Pseudomonas aeruginosa NOT DETECTED NOT DETECTED Final   Candida albicans NOT DETECTED NOT DETECTED Final   Candida glabrata NOT DETECTED NOT DETECTED Final   Candida krusei NOT DETECTED NOT DETECTED Final   Candida parapsilosis NOT DETECTED NOT DETECTED Final   Candida tropicalis NOT DETECTED NOT DETECTED Final  Culture, blood (routine x 2)     Status: Abnormal   Collection Time: 01/10/16 11:30 PM  Result Value Ref Range Status   Specimen Description BLOOD RIGHT ARM  Final   Special Requests BOTTLES DRAWN AEROBIC AND ANAEROBIC  Final   Culture  Setup Time   Final   Culture (A)  Final    ESCHERICHIA COLI    Report Status 01/13/2016 FINAL  Final  Surgical pcr screen     Status: None   Collection Time: 01/11/16  4:25 PM  Result Value Ref Range Status   MRSA, PCR NEGATIVE NEGATIVE Final   Staphylococcus aureus NEGATIVE NEGATIVE Final        Radiology Studies: US Abdomen Complete 01/11/2016  1. Gallstones, gallbladder sludge and diffuse gallbladder wall thickening and pericholecystic fluid compatible with acute cholecystitis. 2. Hepatic steatosis suspected. 3. Left pleural effusion 4. Right perinephric fluid noted without hydronephrosis. Electronically  Signed   By: Signa Kellaylor  Stroud M.D.   On: 01/11/2016 08:19    Scheduled Meds: . cefTRIAXone (ROCEPHIN)  IV  2 g Intravenous Q24H  . heparin  5,000 Units Subcutaneous Q8H  . insulin aspart  0-9 Units Subcutaneous TID WC  . metoprolol succinate  25 mg Oral Daily  . pravastatin  20 mg Oral q1800   Continuous Infusions: . sodium chloride 75 mL/hr at 01/13/16 0630     LOS: 3 days    Time spent: 25 minutes  Greater than 50% of the time spent on counseling and coordinating the care.   Manson PasseyEVINE, Tifani Dack, MD [facility] Pager [phone]  If 7PM-7AM, please contact night-coverage [email or web address] Password [email or web address] 01/13/2016, 8:30 AM

## 2016-01-13 NOTE — Progress Notes (Signed)
1 Day Post-Op  Subjective: Feeling good.  No nausea.  We discussed surgery findings.  Objective: Vital signs in last 24 hours: Temp:  [97.6 F (36.4 C)-101.9 F (38.8 C)] 98.8 F (37.1 C) (05/28 0611) Pulse Rate:  [35-88] 87 (05/28 0611) Resp:  [17-26] 17 (05/28 0611) BP: (108-146)/(61-85) 146/73 mmHg (05/28 0611) SpO2:  [91 %-100 %] 95 % (05/28 0611) Last BM Date: 01/10/16  Intake/Output from previous day: 05/27 0701 - 05/28 0700 In: 2961 [P.O.:120; I.V.:2841] Out: 1625 [Urine:1400; Drains:125; Blood:100] Intake/Output this shift:    PE: General- In NAD Abdomen-soft, incisions are clean and intact, thin serosanguinous drain output  Lab Results:   Recent Labs  01/10/16 2219  WBC 15.7*  HGB 12.9  HCT 38.7  PLT 186   BMET  Recent Labs  01/10/16 2219 01/11/16 0827  NA 134* 137  K 2.8* 3.6  CL 98* 102  CO2 28 25  GLUCOSE 130* 134*  BUN 39* 32*  CREATININE 2.30* 1.98*  CALCIUM 8.7* 8.7*   PT/INR  Recent Labs  01/10/16 2219  LABPROT 15.6*  INR 1.22   Comprehensive Metabolic Panel:    Component Value Date/Time   NA 137 01/11/2016 0827   NA 134* 01/10/2016 2219   K 3.6 01/11/2016 0827   K 2.8* 01/10/2016 2219   CL 102 01/11/2016 0827   CL 98* 01/10/2016 2219   CO2 25 01/11/2016 0827   CO2 28 01/10/2016 2219   BUN 32* 01/11/2016 0827   BUN 39* 01/10/2016 2219   CREATININE 1.98* 01/11/2016 0827   CREATININE 2.30* 01/10/2016 2219   GLUCOSE 134* 01/11/2016 0827   GLUCOSE 130* 01/10/2016 2219   CALCIUM 8.7* 01/11/2016 0827   CALCIUM 8.7* 01/10/2016 2219   AST 56* 01/11/2016 1119   AST 33 01/10/2016 2219   ALT 42 01/11/2016 1119   ALT 27 01/10/2016 2219   ALKPHOS 103 01/11/2016 1119   ALKPHOS 86 01/10/2016 2219   BILITOT 0.8 01/11/2016 1119   BILITOT 0.7 01/10/2016 2219   PROT 6.8 01/11/2016 1119   PROT 6.8 01/10/2016 2219   ALBUMIN 2.5* 01/11/2016 1119   ALBUMIN 2.6* 01/10/2016 2219     Studies/Results: Dg Cholangiogram  Operative  01/12/2016  CLINICAL DATA:  Laparoscopic cholecystectomy EXAM: INTRAOPERATIVE CHOLANGIOGRAM TECHNIQUE: Cholangiographic image from the C-arm fluoroscopic device submitted for interpretation post-operatively. Please see the procedural report for the amount of contrast and the fluoroscopy time utilized. COMPARISON:  None. FINDINGS: Some motion degradation limits resolution. No definite filling defects in the common duct. Intrahepatic ducts are incompletely visualized, appearing decompressed centrally. Contrast passes into the duodenum. : Negative for retained common duct stone. Electronically Signed   By: Corlis Leak  Hassell M.D.   On: 01/12/2016 09:09   [place] Abdomen Complete  01/11/2016  CLINICAL DATA:  Biliary colic followup EXAM: ABDOMEN ULTRASOUND COMPLETE COMPARISON:  01/06/2016. FINDINGS: Gallbladder: Diffuse gallbladder wall thickening is noted which measures up to 9 mm. Multiple stones are identified within the gallbladder as well as gallbladder sludge. The stones measure up to 11 mm. Pericholecystic fluid is noted. Common bile duct: Diameter: 8.7 mm Liver: The liver has a heterogeneous echotexture. IVC: No abnormality visualized. Pancreas: Visualized portion unremarkable. Spleen: Size and appearance within normal limits. Right Kidney: Length: 11.7 cm. Perinephric fluid noted. Echogenicity within normal limits. No mass or hydronephrosis visualized. Left Kidney: Length: 10.4 cm. Echogenicity within normal limits. No mass or hydronephrosis visualized. Abdominal aorta: No aneurysm visualized. Other findings: Left pleural effusion noted. IMPRESSION: 1. Gallstones, gallbladder sludge and  diffuse gallbladder wall thickening and pericholecystic fluid compatible with acute cholecystitis. 2. Hepatic steatosis suspected. 3. Left pleural effusion 4. Right perinephric fluid noted without hydronephrosis. Electronically Signed   By: Signa Kell M.D.   On: 01/11/2016 08:19    Anti-infectives: Anti-infectives     Start     Dose/Rate Route Frequency Ordered Stop   01/10/16 2330  cefTRIAXone (ROCEPHIN) 2 g in dextrose 5 % 50 mL IVPB     2 g 100 mL/hr over 30 Minutes Intravenous Every 24 hours 01/10/16 2310        Assessment Principal Problem:   Acute gangrenous cholecystitis with abscess s/p lap chole with IOC 01/12/16 (Dr. Olga Coaster better than she did preop Active Problems:   Non-insulin dependent type 2 diabetes mellitus (HCC)   AKI (acute kidney injury) (HCC)    LOS: 3 days   Plan: Continue IV abxs, CHO modified solid diet.  Ambulate.   Antonis Lor Shela Commons 01/13/2016

## 2016-01-13 NOTE — Consult Note (Signed)
Ref: [facility]   Subjective:  Feeling better. Awaiting ambulation. No chest pain.  Objective:  Vital Signs in the last 24 hours: Temp:  [97.6 F (36.4 C)-101.9 F (38.8 C)] 98.8 F (37.1 C) (05/28 0611) Pulse Rate:  [35-88] 87 (05/28 0611) Cardiac Rhythm:  [-] Normal sinus rhythm (05/27 1045) Resp:  [17-26] 17 (05/28 0611) BP: (108-146)/(61-85) 146/73 mmHg (05/28 0611) SpO2:  [91 %-100 %] 95 % (05/28 [id number])  Physical Exam: BP Readings from Last 1 Encounters:  01/13/16 146/73    Wt Readings from Last 1 Encounters:  01/10/16 74.39 kg (164 lb)    Weight change:   HEENT: [place]/AT, Eyes-Brown, PERL, EOMI, Conjunctiva-Pink, Sclera-Non-icteric Neck: No JVD, No bruit, Trachea midline. Lungs:  Clear, Bilateral. Cardiac:  Regular rhythm, normal S1 and S2, no S3.  Abdomen:  Soft, decreasing RUQ-tenderness. Extremities:  No edema present. No cyanosis. No clubbing. CNS: AxOx3, Cranial nerves grossly intact, moves all 4 extremities. Right handed. Skin: Warm and dry.   Intake/Output from previous day: 05/27 0701 - 05/28 0700 In: 2961 [P.O.:120; I.V.:2841] Out: 1625 [Urine:1400; Drains:125; Blood:100]    Lab Results: BMET    Component Value Date/Time   NA 137 01/11/2016 0827   NA 134* 01/10/2016 2219   NA 145 01/06/2016 1854   K 3.6 01/11/2016 0827   K 2.8* 01/10/2016 2219   K 3.2* 01/06/2016 1854   CL 102 01/11/2016 0827   CL 98* 01/10/2016 2219   CL 104 01/06/2016 1854   CO2 25 01/11/2016 0827   CO2 28 01/10/2016 2219   CO2 27 01/06/2016 1815   GLUCOSE 134* 01/11/2016 0827   GLUCOSE 130* 01/10/2016 2219   GLUCOSE 120* 01/06/2016 1854   BUN 32* 01/11/2016 0827   BUN 39* 01/10/2016 2219   BUN 33* 01/06/2016 1854   CREATININE 1.98* 01/11/2016 0827   CREATININE 2.30* 01/10/2016 2219   CREATININE 1.40* 01/06/2016 1854   CALCIUM 8.7* 01/11/2016 0827   CALCIUM 8.7* 01/10/2016 2219   CALCIUM 9.4 01/06/2016 1815   GFRNONAA 24* 01/11/2016 0827   GFRNONAA 20*  01/10/2016 2219   GFRNONAA 36* 01/06/2016 1815   GFRAA 28* 01/11/2016 0827   GFRAA 24* 01/10/2016 2219   GFRAA 42* 01/06/2016 1815   CBC    Component Value Date/Time   WBC 15.7* 01/10/2016 2219   RBC 4.18 01/10/2016 2219   HGB 12.9 01/10/2016 2219   HCT 38.7 01/10/2016 2219   PLT 186 01/10/2016 2219   MCV 92.6 01/10/2016 2219   MCH 30.9 01/10/2016 2219   MCHC 33.3 01/10/2016 2219   RDW 13.0 01/10/2016 2219   LYMPHSABS 1.9 01/10/2016 2219   MONOABS 2.1* 01/10/2016 2219   EOSABS 0.0 01/10/2016 2219   BASOSABS 0.0 01/10/2016 2219   HEPATIC Function Panel  Recent Labs  01/06/16 1815 01/10/16 2219 01/11/16 1119  PROT 8.4* 6.8 6.8   HEMOGLOBIN A1C No components found for: HGA1C,  MPG CARDIAC ENZYMES Lab Results  Component Value Date   TROPONINI 0.06* 01/11/2016   TROPONINI 0.04* 01/11/2016   TROPONINI 0.03 01/11/2016   BNP No results for input(s): PROBNP in the last 8760 hours. TSH No results for input(s): TSH in the last 8760 hours. CHOLESTEROL No results for input(s): CHOL in the last 8760 hours.  Scheduled Meds: . cefTRIAXone (ROCEPHIN)  IV  2 g Intravenous Q24H  . heparin  5,000 Units Subcutaneous Q8H  . insulin aspart  0-9 Units Subcutaneous TID WC  . metoprolol succinate  25 mg Oral Daily  .  pravastatin  20 mg Oral q1800   Continuous Infusions: . sodium chloride 75 mL/hr at 01/13/16 0630   PRN Meds:.sodium chloride, acetaminophen **OR** acetaminophen, alum & mag hydroxide-simeth, bisacodyl, HYDROcodone-acetaminophen, HYDROmorphone (DILAUDID) injection, ondansetron **OR** ondansetron (ZOFRAN) IV, polyethylene glycol  Assessment/Plan: Acute cholecystitis S/P Surgery Hypertension DM, II Abnormal troponin-I, minimal and possible non-cardiac source  OP f/U in 1-3 months. Add 81 mg. Aspirin.  Postpone ACE inhibitor use till renal function improves     LOS: 3 days    Orpah Cobb  MD  01/13/2016, 9:23 AM

## 2016-01-14 ENCOUNTER — Encounter (HOSPITAL_COMMUNITY): Payer: Self-pay | Admitting: Surgery

## 2016-01-14 LAB — BASIC METABOLIC PANEL
Anion gap: 8 (ref 5–15)
BUN: 17 mg/dL (ref 6–20)
CHLORIDE: 108 mmol/L (ref 101–111)
CO2: 26 mmol/L (ref 22–32)
CREATININE: 1.46 mg/dL — AB (ref 0.44–1.00)
Calcium: 8.4 mg/dL — ABNORMAL LOW (ref 8.9–10.3)
GFR calc Af Amer: 41 mL/min — ABNORMAL LOW (ref >60)
GFR calc non Af Amer: 35 mL/min — ABNORMAL LOW (ref >60)
GLUCOSE: 126 mg/dL — AB (ref 65–99)
POTASSIUM: 3.5 mmol/L (ref 3.5–5.1)
SODIUM: 142 mmol/L (ref 135–145)

## 2016-01-14 LAB — CBC
HCT: 36.8 % (ref 36.0–46.0)
Hemoglobin: 11.7 g/dL — ABNORMAL LOW (ref 12.0–15.0)
MCH: 30.4 pg (ref 26.0–34.0)
MCHC: 31.8 g/dL (ref 30.0–36.0)
MCV: 95.6 fL (ref 78.0–100.0)
PLATELETS: 233 10*3/uL (ref 150–400)
RBC: 3.85 MIL/uL — AB (ref 3.87–5.11)
RDW: 13.5 % (ref 11.5–15.5)
WBC: 24.5 10*3/uL — ABNORMAL HIGH (ref 4.0–10.5)

## 2016-01-14 LAB — GLUCOSE, CAPILLARY
GLUCOSE-CAPILLARY: 144 mg/dL — AB (ref 65–99)
Glucose-Capillary: 113 mg/dL — ABNORMAL HIGH (ref 65–99)
Glucose-Capillary: 170 mg/dL — ABNORMAL HIGH (ref 65–99)
Glucose-Capillary: 111 mg/dL — ABNORMAL HIGH (ref 65–99)

## 2016-01-14 LAB — PROCALCITONIN: Procalcitonin: 2.73 ng/mL

## 2016-01-14 MED ORDER — SODIUM CHLORIDE 0.9 % IV SOLN
1.0000 g | INTRAVENOUS | Status: DC
  Administered 2016-01-14 – 2016-01-16 (×3): 1 g via INTRAVENOUS
  Filled 2016-01-14 (×3): qty 1

## 2016-01-14 NOTE — Progress Notes (Signed)
2 Days Post-Op  Subjective: Feels full.  Passing some gas.  Objective: Vital signs in last 24 hours: Temp:  [98.4 F (36.9 C)-99.5 F (37.5 C)] 98.8 F (37.1 C) (05/29 0609) Pulse Rate:  [82-90] 90 (05/29 0609) Resp:  [17-18] 17 (05/29 0609) BP: (128-157)/(66-77) 157/76 mmHg (05/29 0609) SpO2:  [91 %-98 %] 92 % (05/29 0609) Last BM Date: 01/10/16  Intake/Output from previous day: 05/28 0701 - 05/29 0700 In: 2280 [P.O.:480; I.V.:1800] Out: 1015 [Urine:1000; Drains:15] Intake/Output this shift:    PE: General- In NAD Abdomen-soft, some epigastric distension, hypoactive bowel sounds, incisions are clean and intact, thin serosanguinous drain output  Lab Results:   Recent Labs  01/13/16 0917 01/14/16 0427  WBC 18.6* 24.5*  HGB 12.2 11.7*  HCT 37.6 36.8  PLT 241 233   BMET  Recent Labs  01/13/16 0917 01/14/16 0427  NA 142 142  K 3.8 3.5  CL 108 108  CO2 26 26  GLUCOSE 119* 126*  BUN 21* 17  CREATININE 1.49* 1.46*  CALCIUM 8.7* 8.4*   PT/INR No results for input(s): LABPROT, INR in the last 72 hours. Comprehensive Metabolic Panel:    Component Value Date/Time   NA 142 01/14/2016 0427   NA 142 01/13/2016 0917   K 3.5 01/14/2016 0427   K 3.8 01/13/2016 0917   CL 108 01/14/2016 0427   CL 108 01/13/2016 0917   CO2 26 01/14/2016 0427   CO2 26 01/13/2016 0917   BUN 17 01/14/2016 0427   BUN 21* 01/13/2016 0917   CREATININE 1.46* 01/14/2016 0427   CREATININE 1.49* 01/13/2016 0917   GLUCOSE 126* 01/14/2016 0427   GLUCOSE 119* 01/13/2016 0917   CALCIUM 8.4* 01/14/2016 0427   CALCIUM 8.7* 01/13/2016 0917   AST 56* 01/11/2016 1119   AST 33 01/10/2016 2219   ALT 42 01/11/2016 1119   ALT 27 01/10/2016 2219   ALKPHOS 103 01/11/2016 1119   ALKPHOS 86 01/10/2016 2219   BILITOT 0.8 01/11/2016 1119   BILITOT 0.7 01/10/2016 2219   PROT 6.8 01/11/2016 1119   PROT 6.8 01/10/2016 2219   ALBUMIN 2.5* 01/11/2016 1119   ALBUMIN 2.6* 01/10/2016 2219      Studies/Results: No results found.  Anti-infectives: Anti-infectives    Start     Dose/Rate Route Frequency Ordered Stop   01/10/16 2330  cefTRIAXone (ROCEPHIN) 2 g in dextrose 5 % 50 mL IVPB     2 g 100 mL/hr over 30 Minutes Intravenous Every 24 hours 01/10/16 2310        Assessment Principal Problem:   Acute gangrenous cholecystitis with abscess s/p lap chole with IOC 01/12/16 (Dr. Fredda Hammedornett)-WBC up Active Problems:   Non-insulin dependent type 2 diabetes mellitus (HCC)-cbgs well controlled   AKI (acute kidney injury) (HCC)-improved    LOS: 4 days   Plan: Change abxs to [employer].  Repeat CBC tomorrow.   Mohmmad Saleeby J 01/14/2016

## 2016-01-14 NOTE — Care Management Important Message (Signed)
Important Message  Patient Details  Name: Angel Gonzalez MRN: [id number] Date of Birth: 09-21-1945   Medicare Important Message Given:  Yes    Aksh Swart P Leahna Hewson 01/14/2016, 10:07 AM

## 2016-01-14 NOTE — Progress Notes (Signed)
Patient ID: Angel Gonzalez, female   DOB: 06-Apr-1946, 70 y.o.   MRN: [id number]  PROGRESS NOTE    PAELYN SMICK  WUJ:[id number] DOB: [birth year] DOA: 01/10/2016  PCP: [facility]   Brief Narrative:  70 year-old female with past medical history significant for hypertension, dyslipidemia, diabetes. Patient was sent from primary care office for evaluation of biliary colic. Patient started having right upper quadrant abdominal pain 01/06/2016 which was associated with nausea and vomiting and then it spontaneously resolved after 30 minutes. Since then however she continued to have recurrence of this pain with increased frequency and duration. She was not able to tolerate any by mouth intake because of nausea and vomiting. She has been seen in [facility] emergency department 01/06/2016 were CT scan demonstrated 12 mm stone in gallbladder neck but there was no evidence of acute cholecystitis so she was subsequently discharged home. Because of ongoing pain and nausea and vomiting she presented to primary care office and was told to come to [facility] cone for further evaluation.  Patient was hemodynamically stable on the admission. Blood work was notable for white blood cell count of 15.7, potassium 2.8, creatinine 2.30, normal lipase and normal LFTs. Her abdominal ultrasound significant for acute cholecystitis. Surgery has seen her in consultation. She is s/p lap cholecystectomy 5/27.   Assessment & Plan:   Principal Problem:   Biliary colic / Acute cholecystitis / leukocytosis - Abdominal ultrasound significant for acute cholecystitis - Normal liver function enzymes, normal lipase - S/P lap cholecystectomy 5/27 and drain placement - Worsening leukocytosis this am so surgery changed abx from rocephin to [facility] - Advance diet per surgery   Active Problems:   E.Coli and Enterobacteriaceae  bacteremia - Blood cultures on admission growing E.Coli and Enterobacteriaceae species - Repeat  blood cultures pending  - Abx changed to Invanz due to worsening leukocytosis     Mild troponin elevation  - Likely secondary to demand ischemia in the setting of acute cholecystitis - Troponin 0.11, 0.03, 0.04 - No acute ischemic changes on admission 12-lead EKG - 2 D ECHO with normal EF - Cardiology following     Essential hypertension - Continue metoprolol 25 mg daily - BP acceptable, 157/76 prior to am meds     Hyperlipidemia - Continue Pravachol    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 (HCC) - A1c is 6.4 - CBGs in past 24 hours: 115, 160, 113 - Continue SSI    Hypokalemia - Likely due to GI losses - Supplemented and WNL    Acute kidney injury - Creatinine 2.3, improving with hydration  - Cr on 5/21 was 1.4 but no other values for comparison  - Cr this am stable at 1.46   DVT prophylaxis: Heparin subcutaneous Code Status: full code  Family Communication: Daughter at the bedside Disposition Plan: Discharge once cleared by surgery    Consultants:   Surgery  Cardiology   Procedures:   Lap chole 01/12/2016   2 D ECHO 01/11/2016 - normal EF, grade 1 DD  Antimicrobials:   Rocephin 01/10/2016 --> 01/14/2016  Invanz 01/14/2016 -->   Subjective: No overnight events. Pain controlled.  Objective: Filed Vitals:   01/13/16 1032 01/13/16 1333 01/13/16 2149 01/14/16 0609  BP: 128/77 129/66 129/70 157/76  Pulse: 82 88 90 90  Temp: 98.4 F (36.9 C) 99.5 F (37.5 C) 98.8 F (37.1 C) 98.8 F (37.1 C)  TempSrc: Oral Oral Oral Oral  Resp: 17  Height:      Weight:      SpO2: 98% 98% 91% 92%    Intake/Output Summary (Last 24 hours) at 01/14/16 1001 Last data filed at 01/14/16 4098  Gross per 24 hour  Intake   2520 ml  Output   1015 ml  Net   1505 ml   Filed Weights   01/10/16 1905  Weight: 74.39 kg (164 lb)    Examination:  General exam: Appears in no acute distress  Respiratory system: Bilateral air  entry, no wheezing  Cardiovascular system: S1 & S2 (+), rate controlled  Gastrointestinal system: (+) BS, soft, non distended, (+) Drain in place  Central nervous system: No focal deficits  Extremities: No edema, palpable pulses  Skin: Warm, dry  Psychiatry: No agitation, no restlessness   Data Reviewed: I have personally reviewed following labs and imaging studies  CBC:  Recent Labs Lab 01/10/16 2219 01/13/16 0917 01/14/16 0427  WBC 15.7* 18.6* 24.5*  NEUTROABS 11.7*  --   --   HGB 12.9 12.2 11.7*  HCT 38.7 37.6 36.8  MCV 92.6 94.0 95.6  PLT 186 241 233   Basic Metabolic Panel:  Recent Labs Lab 01/10/16 2219 01/11/16 0827 01/13/16 0917 01/14/16 0427  NA 134* 137 142 142  K 2.8* 3.6 3.8 3.5  CL 98* 102 108 108  CO2 28 25 26 26   GLUCOSE 130* 134* 119* 126*  BUN 39* 32* 21* 17  CREATININE 2.30* 1.98* 1.49* 1.46*  CALCIUM 8.7* 8.7* 8.7* 8.4*  MG 2.1  --   --   --    GFR: Estimated Creatinine Clearance: 34.6 mL/min (by C-G formula based on Cr of 1.46). Liver Function Tests:  Recent Labs Lab 01/10/16 2219 01/11/16 1119  AST 33 56*  ALT 27 42  ALKPHOS 86 103  BILITOT 0.7 0.8  PROT 6.8 6.8  ALBUMIN 2.6* 2.5*    Recent Labs Lab 01/10/16 2219  LIPASE 20  AMYLASE 46   No results for input(s): AMMONIA in the last 168 hours. Coagulation Profile:  Recent Labs Lab 01/10/16 2219  INR 1.22   Cardiac Enzymes:  Recent Labs Lab 01/10/16 2219 01/11/16 0030 01/11/16 0827 01/11/16 1152  TROPONINI 0.11* 0.03 0.04* 0.06*   BNP (last 3 results) No results for input(s): PROBNP in the last 8760 hours. HbA1C: No results for input(s): HGBA1C in the last 72 hours. CBG:  Recent Labs Lab 01/13/16 0756 01/13/16 1153 01/13/16 1639 01/13/16 2151 01/14/16 0737  GLUCAP 103* 147* 115* 160* 113*   Lipid Profile: No results for input(s): CHOL, HDL, LDLCALC, TRIG, CHOLHDL, LDLDIRECT in the last 72 hours. Thyroid Function Tests: No results for input(s): TSH,  T4TOTAL, FREET4, T3FREE, THYROIDAB in the last 72 hours. Anemia Panel: No results for input(s): VITAMINB12, FOLATE, FERRITIN, TIBC, IRON, RETICCTPCT in the last 72 hours. Urine analysis:    Component Value Date/Time   COLORURINE YELLOW 01/10/2016 2353   APPEARANCEUR CLEAR 01/10/2016 2353   LABSPEC 1.026 01/10/2016 2353   PHURINE 5.5 01/10/2016 2353   GLUCOSEU >1000* 01/10/2016 2353   HGBUR NEGATIVE 01/10/2016 2353   BILIRUBINUR NEGATIVE 01/10/2016 2353   KETONESUR NEGATIVE 01/10/2016 2353   PROTEINUR NEGATIVE 01/10/2016 2353   NITRITE NEGATIVE 01/10/2016 2353   LEUKOCYTESUR NEGATIVE 01/10/2016 2353   Sepsis Labs: @LABRCNTIP (procalcitonin:4,lacticidven:4)   Culture, blood (routine x 2)     Status: Abnormal   Collection Time: 01/10/16 11:20 PM  Result Value Ref Range Status   Specimen Description BLOOD LEFT ARM  Final  Special Requests BOTTLES DRAWN AEROBIC AND ANAEROBIC 5ML  Final   Culture  Setup Time   Final   Culture ESCHERICHIA COLI (A)  Final   Report Status 01/13/2016 FINAL  Final   Organism ID, Bacteria ESCHERICHIA COLI  Final      Susceptibility   Escherichia coli - MIC*    AMPICILLIN >=32 RESISTANT Resistant     CEFAZOLIN 8 SENSITIVE Sensitive     CEFEPIME <=1 SENSITIVE Sensitive     CEFTAZIDIME <=1 SENSITIVE Sensitive     CEFTRIAXONE <=1 SENSITIVE Sensitive     CIPROFLOXACIN <=0.25 SENSITIVE Sensitive     GENTAMICIN <=1 SENSITIVE Sensitive     IMIPENEM <=0.25 SENSITIVE Sensitive     TRIMETH/SULFA <=20 SENSITIVE Sensitive     AMPICILLIN/SULBACTAM 16 INTERMEDIATE Intermediate     PIP/TAZO <=4 SENSITIVE Sensitive     * ESCHERICHIA COLI  Blood Culture ID Panel (Reflexed)     Status: Abnormal   Collection Time: 01/10/16 11:20 PM  Result Value Ref Range Status   Enterococcus species NOT DETECTED NOT DETECTED Final   Vancomycin resistance NOT DETECTED NOT DETECTED Final   Listeria monocytogenes NOT DETECTED NOT DETECTED Final   Staphylococcus species NOT DETECTED  NOT DETECTED Final   Staphylococcus aureus NOT DETECTED NOT DETECTED Final   Methicillin resistance NOT DETECTED NOT DETECTED Final   Streptococcus species NOT DETECTED NOT DETECTED Final   Streptococcus agalactiae NOT DETECTED NOT DETECTED Final   Streptococcus pneumoniae NOT DETECTED NOT DETECTED Final   Streptococcus pyogenes NOT DETECTED NOT DETECTED Final   Acinetobacter baumannii NOT DETECTED NOT DETECTED Final   Enterobacteriaceae specie DETECTED (A) NOT DETECTED Final   Enterobacter cloacae comple NOT DETECTED NOT DETECTED Final   Escherichia coli DETECTED (A) NOT DETECTED Final   Klebsiella oxytoca NOT DETECTED NOT DETECTED Final   Klebsiella pneumoniae NOT DETECTED NOT DETECTED Final   Proteus species NOT DETECTED NOT DETECTED Final   Serratia marcescens NOT DETECTED NOT DETECTED Final   Carbapenem resistance NOT DETECTED NOT DETECTED Final   Haemophilus influenzae NOT DETECTED NOT DETECTED Final   Neisseria meningitidis NOT DETECTED NOT DETECTED Final   Pseudomonas aeruginosa NOT DETECTED NOT DETECTED Final   Candida albicans NOT DETECTED NOT DETECTED Final   Candida glabrata NOT DETECTED NOT DETECTED Final   Candida krusei NOT DETECTED NOT DETECTED Final   Candida parapsilosis NOT DETECTED NOT DETECTED Final   Candida tropicalis NOT DETECTED NOT DETECTED Final  Culture, blood (routine x 2)     Status: Abnormal   Collection Time: 01/10/16 11:30 PM  Result Value Ref Range Status   Specimen Description BLOOD RIGHT ARM  Final   Special Requests BOTTLES DRAWN AEROBIC AND ANAEROBIC 5ML  Final   Culture  Setup Time   Final   Culture (A)  Final    ESCHERICHIA COLI    Report Status 01/13/2016 FINAL  Final  Surgical pcr screen     Status: None   Collection Time: 01/11/16  4:25 PM  Result Value Ref Range Status   MRSA, PCR NEGATIVE NEGATIVE Final   Staphylococcus aureus NEGATIVE NEGATIVE Final      Radiology Studies: [place] Abdomen Complete 01/11/2016  1. Gallstones,  gallbladder sludge and diffuse gallbladder wall thickening and pericholecystic fluid compatible with acute cholecystitis. 2. Hepatic steatosis suspected. 3. Left pleural effusion 4. Right perinephric fluid noted without hydronephrosis. Electronically Signed   By: Signa Kellaylor  Stroud M.D.   On: 01/11/2016 08:19    Scheduled Meds: . aspirin EC  81 mg Oral Daily  . ertapenem  1 g Intravenous Q24H  . heparin  5,000 Units Subcutaneous Q8H  . insulin aspart  0-9 Units Subcutaneous TID WC  . metoprolol succinate  25 mg Oral Daily  . pravastatin  20 mg Oral q1800   Continuous Infusions: . sodium chloride 75 mL/hr at 01/13/16 1944     LOS: 4 days    Time spent: 25 minutes  Greater than 50% of the time spent on counseling and coordinating the care.   Manson Passey, MD [facility] Pager [phone]  If 7PM-7AM, please contact night-coverage [email or web address] Password [email or web address] 01/14/2016, 10:01 AM

## 2016-01-15 LAB — BASIC METABOLIC PANEL
Anion gap: 9 (ref 5–15)
BUN: 15 mg/dL (ref 6–20)
CO2: 22 mmol/L (ref 22–32)
Calcium: 8.3 mg/dL — ABNORMAL LOW (ref 8.9–10.3)
Chloride: 109 mmol/L (ref 101–111)
Creatinine, Ser: 1.2 mg/dL — ABNORMAL HIGH (ref 0.44–1.00)
GFR calc Af Amer: 52 mL/min — ABNORMAL LOW (ref >60)
GFR, EST NON AFRICAN AMERICAN: 45 mL/min — AB (ref >60)
GLUCOSE: 102 mg/dL — AB (ref 65–99)
Potassium: 3.3 mmol/L — ABNORMAL LOW (ref 3.5–5.1)
Sodium: 140 mmol/L (ref 135–145)

## 2016-01-15 LAB — GLUCOSE, CAPILLARY
GLUCOSE-CAPILLARY: 125 mg/dL — AB (ref 65–99)
GLUCOSE-CAPILLARY: 83 mg/dL (ref 65–99)
GLUCOSE-CAPILLARY: 130 mg/dL — AB (ref 65–99)
Glucose-Capillary: 100 mg/dL — ABNORMAL HIGH (ref 65–99)
Glucose-Capillary: 69 mg/dL (ref 65–99)

## 2016-01-15 LAB — CBC
HEMATOCRIT: 35.7 % — AB (ref 36.0–46.0)
Hemoglobin: 11.4 g/dL — ABNORMAL LOW (ref 12.0–15.0)
MCH: 30.3 pg (ref 26.0–34.0)
MCHC: 31.9 g/dL (ref 30.0–36.0)
MCV: 94.9 fL (ref 78.0–100.0)
Platelets: 263 10*3/uL (ref 150–400)
RBC: 3.76 MIL/uL — ABNORMAL LOW (ref 3.87–5.11)
RDW: 13.6 % (ref 11.5–15.5)
WBC: 24.6 10*3/uL — ABNORMAL HIGH (ref 4.0–10.5)

## 2016-01-15 MED ORDER — POTASSIUM CHLORIDE 10 MEQ/100ML IV SOLN
10.0000 meq | INTRAVENOUS | Status: AC
  Administered 2016-01-15 (×3): 10 meq via INTRAVENOUS
  Filled 2016-01-15 (×3): qty 100

## 2016-01-15 MED ORDER — AMLODIPINE BESYLATE 5 MG PO TABS
5.0000 mg | ORAL_TABLET | Freq: Every day | ORAL | Status: DC
  Administered 2016-01-15 – 2016-01-19 (×5): 5 mg via ORAL
  Filled 2016-01-15 (×5): qty 1

## 2016-01-15 NOTE — Progress Notes (Signed)
Patient ID: Angel Gonzalez, female   DOB: Sep 20, 1945, 70 y.o.   MRN: [id number]     [facility]      [address] [place], [place] [address]    Phone: [phone] FAX: [phone]     Subjective: Tolerating POs. Little pain. Passing flatus. Ambulating in hallways. Afebrile.  WBC essentially unchanged at 25k with change to invanz.   Objective:  Vital signs:  Filed Vitals:   01/14/16 0609 01/14/16 1325 01/14/16 2229 01/15/16 0455  BP: 157/76 154/81 122/63 152/72  Pulse: 90 87 80 90  Temp: 98.8 F (37.1 C) 98.8 F (37.1 C) 98.7 F (37.1 C) 99.1 F (37.3 C)  TempSrc: Oral Oral Oral Oral  Resp: _0 Height:      Weight:      SpO2: 92% 93% 90% 91%    Last BM Date: 01/10/16  Intake/Output   Yesterday:  05/29 0701 - 05/30 0700 In: 3058 [P.O.:1200; I.V.:1858] Out: 2751 [Urine:1250; Drains:25] This shift:  Total I/O In: 240 [P.O.:240] Out: 300 [Urine:300]   Physical Exam: General: Pt awake/alert/oriented x4 in no acute distress  Abdomen: Soft.  Nondistended.  Mildly tender at incisions only.  JP drain with serosanguinous output. No evidence of peritonitis.  No incarcerated hernias.   Problem List:   Principal Problem:   Cholecystitis, acute s/p lap chole 01/12/16 Active Problems:   Essential hypertension   Hyperlipidemia   Non-insulin dependent type 2 diabetes mellitus ([place])   Biliary colic   Chronic low back pain   Hypokalemia   AKI (acute kidney injury) ([place])   Acute gangrenous cholecystitis    Results:   Labs: Results for orders placed or performed during the hospital encounter of 01/10/16 (from the past 48 hour(s))  Glucose, capillary     Status: Abnormal   Collection Time: 01/13/16 11:53 AM  Result Value Ref Range   Glucose-Capillary 147 (H) 65 - 99 mg/dL  Glucose, capillary     Status: Abnormal   Collection Time: 01/13/16  4:39 PM  Result Value Ref Range   Glucose-Capillary 115 (H) 65 -  99 mg/dL   Comment 1 Notify RN   Glucose, capillary     Status: Abnormal   Collection Time: 01/13/16  9:51 PM  Result Value Ref Range   Glucose-Capillary 160 (H) 65 - 99 mg/dL  Procalcitonin     Status: None   Collection Time: 01/14/16  4:27 AM  Result Value Ref Range   Procalcitonin 2.73 ng/mL    Comment:        Interpretation: PCT > 2 ng/mL: Systemic infection (sepsis) is likely, unless other causes are known. (NOTE)         ICU PCT Algorithm               Non ICU PCT Algorithm    ----------------------------     ------------------------------         PCT < 0.25 ng/mL                 PCT < 0.1 ng/mL     Stopping of antibiotics            Stopping of antibiotics       strongly encouraged.               strongly encouraged.    ----------------------------     ------------------------------       PCT level decrease by  PCT < 0.25 ng/mL       >= 80% from peak PCT       OR PCT 0.25 - 0.5 ng/mL          Stopping of antibiotics                                             encouraged.     Stopping of antibiotics           encouraged.    ----------------------------     ------------------------------       PCT level decrease by              PCT >= 0.25 ng/mL       < 80% from peak PCT        AND PCT >= 0.5 ng/mL            Continuing antibiotics                                               encouraged.       Continuing antibiotics            encouraged.    ----------------------------     ------------------------------     PCT level increase compared          PCT > 0.5 ng/mL         with peak PCT AND          PCT >= 0.5 ng/mL             Escalation of antibiotics                                          strongly encouraged.      Escalation of antibiotics        strongly encouraged.   CBC     Status: Abnormal   Collection Time: 01/14/16  4:27 AM  Result Value Ref Range   WBC 24.5 (H) 4.0 - 10.5 K/uL   RBC 3.85 (L) 3.87 - 5.11 MIL/uL   Hemoglobin 11.7 (L) 12.0 -  15.0 g/dL   HCT 36.8 36.0 - 46.0 %   MCV 95.6 78.0 - 100.0 fL   MCH 30.4 26.0 - 34.0 pg   MCHC 31.8 30.0 - 36.0 g/dL   RDW 13.5 11.5 - 15.5 %   Platelets 233 150 - 400 K/uL  Basic metabolic panel     Status: Abnormal   Collection Time: 01/14/16  4:27 AM  Result Value Ref Range   Sodium 142 135 - 145 mmol/L   Potassium 3.5 3.5 - 5.1 mmol/L   Chloride 108 101 - 111 mmol/L   CO2 26 22 - 32 mmol/L   Glucose, Bld 126 (H) 65 - 99 mg/dL   BUN 17 6 - 20 mg/dL   Creatinine, Ser 1.46 (H) 0.44 - 1.00 mg/dL   Calcium 8.4 (L) 8.9 - 10.3 mg/dL   GFR calc non Af Amer 35 (L) >60 mL/min   GFR calc Af Amer 41 (L) >60 mL/min    Comment: (NOTE) The eGFR has been calculated using the CKD EPI equation. This calculation has  not been validated in all clinical situations. eGFR's persistently <60 mL/min signify possible Chronic Kidney Disease.    Anion gap 8 5 - 15  Glucose, capillary     Status: Abnormal   Collection Time: 01/14/16  7:37 AM  Result Value Ref Range   Glucose-Capillary 113 (H) 65 - 99 mg/dL  Glucose, capillary     Status: Abnormal   Collection Time: 01/14/16 11:20 AM  Result Value Ref Range   Glucose-Capillary 170 (H) 65 - 99 mg/dL  Glucose, capillary     Status: Abnormal   Collection Time: 01/14/16  5:17 PM  Result Value Ref Range   Glucose-Capillary 111 (H) 65 - 99 mg/dL  Glucose, capillary     Status: Abnormal   Collection Time: 01/14/16  8:42 PM  Result Value Ref Range   Glucose-Capillary 144 (H) 65 - 99 mg/dL  CBC     Status: Abnormal   Collection Time: 01/15/16  5:01 AM  Result Value Ref Range   WBC 24.6 (H) 4.0 - 10.5 K/uL   RBC 3.76 (L) 3.87 - 5.11 MIL/uL   Hemoglobin 11.4 (L) 12.0 - 15.0 g/dL   HCT 35.7 (L) 36.0 - 46.0 %   MCV 94.9 78.0 - 100.0 fL   MCH 30.3 26.0 - 34.0 pg   MCHC 31.9 30.0 - 36.0 g/dL   RDW 13.6 11.5 - 15.5 %   Platelets 263 150 - 400 K/uL  Basic metabolic panel     Status: Abnormal   Collection Time: 01/15/16  5:01 AM  Result Value Ref Range    Sodium 140 135 - 145 mmol/L   Potassium 3.3 (L) 3.5 - 5.1 mmol/L   Chloride 109 101 - 111 mmol/L   CO2 22 22 - 32 mmol/L   Glucose, Bld 102 (H) 65 - 99 mg/dL   BUN 15 6 - 20 mg/dL   Creatinine, Ser 1.20 (H) 0.44 - 1.00 mg/dL   Calcium 8.3 (L) 8.9 - 10.3 mg/dL   GFR calc non Af Amer 45 (L) >60 mL/min   GFR calc Af Amer 52 (L) >60 mL/min    Comment: (NOTE) The eGFR has been calculated using the CKD EPI equation. This calculation has not been validated in all clinical situations. eGFR's persistently <60 mL/min signify possible Chronic Kidney Disease.    Anion gap 9 5 - 15  Glucose, capillary     Status: Abnormal   Collection Time: 01/15/16  7:44 AM  Result Value Ref Range   Glucose-Capillary 100 (H) 65 - 99 mg/dL    Imaging / Studies: No results found.  Medications / Allergies:  Scheduled Meds: . aspirin EC  81 mg Oral Daily  . ertapenem  1 g Intravenous Q24H  . heparin  5,000 Units Subcutaneous Q8H  . insulin aspart  0-9 Units Subcutaneous TID WC  . metoprolol succinate  25 mg Oral Daily  . potassium chloride  10 mEq Intravenous Q1 Hr x 3  . pravastatin  20 mg Oral q1800   Continuous Infusions: . sodium chloride 75 mL/hr at 01/15/16 0546   PRN Meds:.sodium chloride, acetaminophen **OR** acetaminophen, alum & mag hydroxide-simeth, bisacodyl, HYDROcodone-acetaminophen, HYDROmorphone (DILAUDID) injection, ondansetron **OR** ondansetron (ZOFRAN) IV, polyethylene glycol  Antibiotics: Anti-infectives    Start     Dose/Rate Route Frequency Ordered Stop   01/14/16 0930  ertapenem (INVANZ) 1 g in sodium chloride 0.9 % 50 mL IVPB     1 g 100 mL/hr over 30 Minutes Intravenous Every 24 hours 01/14/16 0842  01/10/16 2330  cefTRIAXone (ROCEPHIN) 2 g in dextrose 5 % 50 mL IVPB  Status:  Discontinued     2 g 100 mL/hr over 30 Minutes Intravenous Every 24 hours 01/10/16 2310 01/14/16 0842        Assessment/Plan Gangrenous cholecystitis with abscess POD#3 laparoscopic  cholecystectomy with IOC---Dr. Cornett -WBC unchanged with change to Invanz.  Repeat CBC in AM. -continue JP drain(serosanguinous output -mobilize, IS FEN-no issues VTE prophylaxis-SCD/heparin ID-rocephin.  Invanz 5/29-->.  BC 5/25 with e coli bacteremia. Repeat BCx 5/8---NTD  Erby Pian, ANP-BC [facility] Pager [phone](7A-4:30P) For consults and floor pages call [phone](7A-4:30P)  01/15/2016 11:10 AM

## 2016-01-15 NOTE — Progress Notes (Signed)
Pt's family requests that patient not have picc line. Texted Dr. Elisabeth Pigeonevine to inform her that VAS team was able to place a PIV and did not need a picc. Did not receive a callback or an order to d/c picc placement.

## 2016-01-15 NOTE — Progress Notes (Signed)
Patient ID: Angel Gonzalez, female   DOB: July 17, 1946, 70 y.o.   MRN: [id number]  PROGRESS NOTE    Angel Gonzalez  WUJ:[id number] DOB: 28-Jun-1946 DOA: 01/10/2016  PCP: [facility]   Brief Narrative:  70 year-old female with past medical history significant for hypertension, dyslipidemia, diabetes. Patient was sent from primary care office for evaluation of biliary colic. Patient started having right upper quadrant abdominal pain 01/06/2016 which was associated with nausea and vomiting and then it spontaneously resolved after 30 minutes. Since then however she continued to have recurrence of this pain with increased frequency and duration. She was not able to tolerate any by mouth intake because of nausea and vomiting. She has been seen in [facility] emergency department 01/06/2016 were CT scan demonstrated 12 mm stone in gallbladder neck but there was no evidence of acute cholecystitis so she was subsequently discharged home. Because of ongoing pain and nausea and vomiting she presented to primary care office and was told to come to [facility] for further evaluation.  Patient was hemodynamically stable on the admission. Blood work was notable for white blood cell count of 15.7, potassium 2.8, creatinine 2.30, normal lipase and normal LFTs. Her abdominal ultrasound significant for acute cholecystitis. Surgery has seen her in consultation. She is s/p lap cholecystectomy 5/27.   Assessment & Plan:   Principal Problem:   Biliary colic / Acute cholecystitis / leukocytosis - Abdominal ultrasound significant for acute cholecystitis - Normal liver function enzymes, normal lipase - S/P lap cholecystectomy 5/27 and drain placement - Worsening leukocytosis so surgery changed abx from rocephin to Invanz 01/14/2016 - Diet as tolerated   Active Problems:   E.Coli and Enterobacteriaceae  bacteremia - Blood cultures on admission growing E.Coli and Enterobacteriaceae species - Repeat blood  cultures showed no growth so far - Abx changed to Invanz due to worsening leukocytosis     Mild troponin elevation  - Likely secondary to demand ischemia in the setting of acute cholecystitis - Troponin 0.11, 0.03, 0.04 - No acute ischemic changes on admission 12-lead EKG - 2 D ECHO with normal EF - Cardiology following     Essential hypertension - Continue metoprolol 25 mg daily and Norvasc 5 mg daily     Hyperlipidemia - Continue Pravachol    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 (HCC) - A1c is 6.4 - CBGs in past 24 hours: 144, 100, 125 - Continue SSI    Hypokalemia - Likely due to GI losses - Supplemented  - Check magnesium and BMP in am    Acute kidney injury - Creatinine 2.3, improving with hydration  - Cr on 5/21 was 1.4 but no other values for comparison  - Cr improving with hydration - Cr 1.20 this am   DVT prophylaxis: Heparin subcutaneous Code Status: full code  Family Communication: Daughter at the bedside Disposition Plan: Discharge once cleared by surgery    Consultants:   Surgery  Cardiology   Procedures:   Lap chole 01/12/2016   2 D ECHO 01/11/2016 - normal EF, grade 1 DD  Antimicrobials:   Rocephin 01/10/2016 --> 01/14/2016  Invanz 01/14/2016 -->   Subjective: No overnight events. No nausea or vomiting. Pain is controlled.   Objective: Filed Vitals:   01/14/16 1325 01/14/16 2229 01/15/16 0455 01/15/16 1347  BP: 154/81 122/63 152/72 148/67  Pulse: 87 80 90 87  Temp: 98.8 F (37.1 C) 98.7 F (37.1 C) 99.1 F (37.3 C) 98.8 F (37.1 C)  TempSrc: Oral Oral Oral Oral  Resp: 18 18 18    Height:      Weight:      SpO2: 93% 90% 91% 92%    Intake/Output Summary (Last 24 hours) at 01/15/16 1439 Last data filed at 01/15/16 1426  Gross per 24 hour  Intake   2458 ml  Output    315 ml  Net   2143 ml   Filed Weights   01/10/16 1905  Weight: 74.39 kg (164 lb)    Examination:  General exam:  Appears calm and comfortable, no acute distress  Respiratory system: No rhonchi, no wheezing  Cardiovascular system: S1 & S2 appreciated, rate controlled  Gastrointestinal system: (+) BS, soft, non distended, (+) drain in place  Central nervous system: Nonfocal, alert and awake  Extremities: No swelling, palpable pulses  Skin: No lesions or ulcers  Psychiatry: Normal mood and behavior. No agitation, no restlessness.   Data Reviewed: I have personally reviewed following labs and imaging studies  CBC:  Recent Labs Lab 01/10/16 2219 01/13/16 0917 01/14/16 0427 01/15/16 0501  WBC 15.7* 18.6* 24.5* 24.6*  NEUTROABS 11.7*  --   --   --   HGB 12.9 12.2 11.7* 11.4*  HCT 38.7 37.6 36.8 35.7*  MCV 92.6 94.0 95.6 94.9  PLT 186 241 233 263   Basic Metabolic Panel:  Recent Labs Lab 01/10/16 2219 01/11/16 0827 01/13/16 0917 01/14/16 0427 01/15/16 0501  NA 134* 137 142 142 140  K 2.8* 3.6 3.8 3.5 3.3*  CL 98* 102 108 108 109  CO2 28 25 26 26 22   GLUCOSE 130* 134* 119* 126* 102*  BUN 39* 32* 21* 17 15  CREATININE 2.30* 1.98* 1.49* 1.46* 1.20*  CALCIUM 8.7* 8.7* 8.7* 8.4* 8.3*  MG 2.1  --   --   --   --    GFR: Estimated Creatinine Clearance: 42.1 mL/min (by C-G formula based on Cr of 1.2). Liver Function Tests:  Recent Labs Lab 01/10/16 2219 01/11/16 1119  AST 33 56*  ALT 27 42  ALKPHOS 86 103  BILITOT 0.7 0.8  PROT 6.8 6.8  ALBUMIN 2.6* 2.5*    Recent Labs Lab 01/10/16 2219  LIPASE 20  AMYLASE 46   No results for input(s): AMMONIA in the last 168 hours. Coagulation Profile:  Recent Labs Lab 01/10/16 2219  INR 1.22   Cardiac Enzymes:  Recent Labs Lab 01/10/16 2219 01/11/16 0030 01/11/16 0827 01/11/16 1152  TROPONINI 0.11* 0.03 0.04* 0.06*   BNP (last 3 results) No results for input(s): PROBNP in the last 8760 hours. HbA1C: No results for input(s): HGBA1C in the last 72 hours. CBG:  Recent Labs Lab 01/14/16 1120 01/14/16 1717 01/14/16 2042  01/15/16 0744 01/15/16 1119  GLUCAP 170* 111* 144* 100* 125*   Lipid Profile: No results for input(s): CHOL, HDL, LDLCALC, TRIG, CHOLHDL, LDLDIRECT in the last 72 hours. Thyroid Function Tests: No results for input(s): TSH, T4TOTAL, FREET4, T3FREE, THYROIDAB in the last 72 hours. Anemia Panel: No results for input(s): VITAMINB12, FOLATE, FERRITIN, TIBC, IRON, RETICCTPCT in the last 72 hours. Urine analysis:    Component Value Date/Time   COLORURINE YELLOW 01/10/2016 2353   APPEARANCEUR CLEAR 01/10/2016 2353   LABSPEC 1.026 01/10/2016 2353   PHURINE 5.5 01/10/2016 2353   GLUCOSEU >1000* 01/10/2016 2353   HGBUR NEGATIVE 01/10/2016 2353   BILIRUBINUR NEGATIVE 01/10/2016 2353   KETONESUR NEGATIVE 01/10/2016 2353   PROTEINUR NEGATIVE 01/10/2016 2353   NITRITE NEGATIVE 01/10/2016 2353   LEUKOCYTESUR  NEGATIVE 01/10/2016 2353   Sepsis Labs: @LABRCNTIP (procalcitonin:4,lacticidven:4)   Culture, blood (routine x 2)     Status: Abnormal   Collection Time: 01/10/16 11:20 PM  Result Value Ref Range Status   Specimen Description BLOOD LEFT ARM  Final   Special Requests BOTTLES DRAWN AEROBIC AND ANAEROBIC 5ML  Final   Culture  Setup Time   Final   Culture ESCHERICHIA COLI (A)  Final   Report Status 01/13/2016 FINAL  Final   Organism ID, Bacteria ESCHERICHIA COLI  Final      Susceptibility   Escherichia coli - MIC*    AMPICILLIN >=32 RESISTANT Resistant     CEFAZOLIN 8 SENSITIVE Sensitive     CEFEPIME <=1 SENSITIVE Sensitive     CEFTAZIDIME <=1 SENSITIVE Sensitive     CEFTRIAXONE <=1 SENSITIVE Sensitive     CIPROFLOXACIN <=0.25 SENSITIVE Sensitive     GENTAMICIN <=1 SENSITIVE Sensitive     IMIPENEM <=0.25 SENSITIVE Sensitive     TRIMETH/SULFA <=20 SENSITIVE Sensitive     AMPICILLIN/SULBACTAM 16 INTERMEDIATE Intermediate     PIP/TAZO <=4 SENSITIVE Sensitive     * ESCHERICHIA COLI  Blood Culture ID Panel (Reflexed)     Status: Abnormal   Collection Time: 01/10/16 11:20 PM  Result  Value Ref Range Status   Enterococcus species NOT DETECTED NOT DETECTED Final   Vancomycin resistance NOT DETECTED NOT DETECTED Final   Listeria monocytogenes NOT DETECTED NOT DETECTED Final   Staphylococcus species NOT DETECTED NOT DETECTED Final   Staphylococcus aureus NOT DETECTED NOT DETECTED Final   Methicillin resistance NOT DETECTED NOT DETECTED Final   Streptococcus species NOT DETECTED NOT DETECTED Final   Streptococcus agalactiae NOT DETECTED NOT DETECTED Final   Streptococcus pneumoniae NOT DETECTED NOT DETECTED Final   Streptococcus pyogenes NOT DETECTED NOT DETECTED Final   Acinetobacter baumannii NOT DETECTED NOT DETECTED Final   Enterobacteriaceae specie DETECTED (A) NOT DETECTED Final   Enterobacter cloacae comple NOT DETECTED NOT DETECTED Final   Escherichia coli DETECTED (A) NOT DETECTED Final   Klebsiella oxytoca NOT DETECTED NOT DETECTED Final   Klebsiella pneumoniae NOT DETECTED NOT DETECTED Final   Proteus species NOT DETECTED NOT DETECTED Final   Serratia marcescens NOT DETECTED NOT DETECTED Final   Carbapenem resistance NOT DETECTED NOT DETECTED Final   Haemophilus influenzae NOT DETECTED NOT DETECTED Final   Neisseria meningitidis NOT DETECTED NOT DETECTED Final   Pseudomonas aeruginosa NOT DETECTED NOT DETECTED Final   Candida albicans NOT DETECTED NOT DETECTED Final   Candida glabrata NOT DETECTED NOT DETECTED Final   Candida krusei NOT DETECTED NOT DETECTED Final   Candida parapsilosis NOT DETECTED NOT DETECTED Final   Candida tropicalis NOT DETECTED NOT DETECTED Final  Culture, blood (routine x 2)     Status: Abnormal   Collection Time: 01/10/16 11:30 PM  Result Value Ref Range Status   Specimen Description BLOOD RIGHT ARM  Final   Special Requests BOTTLES DRAWN AEROBIC AND ANAEROBIC 5ML  Final   Culture  Setup Time   Final   Culture (A)  Final    ESCHERICHIA COLI    Report Status 01/13/2016 FINAL  Final  Surgical pcr screen     Status: None    Collection Time: 01/11/16  4:25 PM  Result Value Ref Range Status   MRSA, PCR NEGATIVE NEGATIVE Final   Staphylococcus aureus NEGATIVE NEGATIVE Final      Radiology Studies: [place] Abdomen Complete 01/11/2016  1. Gallstones, gallbladder sludge and diffuse gallbladder wall thickening  and pericholecystic fluid compatible with acute cholecystitis. 2. Hepatic steatosis suspected. 3. Left pleural effusion 4. Right perinephric fluid noted without hydronephrosis. Electronically Signed   By: Signa Kell M.D.   On: 01/11/2016 08:19      Scheduled Meds: . amLODipine  5 mg Oral Daily  . aspirin EC  81 mg Oral Daily  . ertapenem  1 g Intravenous Q24H  . heparin  5,000 Units Subcutaneous Q8H  . insulin aspart  0-9 Units Subcutaneous TID WC  . metoprolol succinate  25 mg Oral Daily  . pravastatin  20 mg Oral q1800   Continuous Infusions: . sodium chloride 75 mL/hr at 01/15/16 0546     LOS: 5 days    Time spent: 25 minutes  Greater than 50% of the time spent on counseling and coordinating the care.   Manson Passey, MD [facility] Pager [phone]  If 7PM-7AM, please contact night-coverage [email or web address] Password [facility] 01/15/2016, 2:39 PM

## 2016-01-15 NOTE — Progress Notes (Signed)
Pt states she wants her medications PO so she does not have to have another IV started. I contacted Brynda Rimarol RN and she will notify the MD of this pt's request. Consuello Masseimmons, Journei Thomassen M

## 2016-01-15 NOTE — Consult Note (Signed)
[facility] for Infectious Disease  Total days of antibiotics 6        Day 2 ertapenem               Reason for Consult: ecoli bacteremia and gangrenous cholecystitis    Referring Physician: Zella Richer  Principal Problem:   Cholecystitis, acute s/p lap chole 01/12/16 Active Problems:   Essential hypertension   Hyperlipidemia   Non-insulin dependent type 2 diabetes mellitus (HCC)   Biliary colic   Chronic low back pain   Hypokalemia   AKI (acute kidney injury) ([place])   Acute gangrenous cholecystitis    HPI: Angel Gonzalez is a 70 y.o. female with hx of HTN ,DM, HLD who presented with RUQ pain associated with eating on 5/21. First seen in ED that noted non-obstructive stone 55m in gallbladder neck and referred back to PCP for follow up. She returned to be evaluated on 5/25 due to ongoing symptoms plus fevers and rigors,N/V with eating concerning for acute cholecystitis. Initial work up in ED revealed, WBC 15.7k, with some AKI Cr 2.3, normal LFTs, normal lipase. Abdominal UKorearevealed gallstones, wall thickening and pericholecystic fluid. she was empicirally started on abtx and then narrowed to ceftriaxone due to her blood cx showing ecoli. she initially was afebrile but then had tmax of 101.2  On day of surgery. She underwent lap chole on 5/27 where she was found to have gangrenous cholecystitis with abscess. In the last 48hr, she has increase in her WBC thus prompting change in abtx from ceftriaxone to ertapenem given concern for resistant pathogen.   Past Medical History  Diagnosis Date  . Hypertension   . Diabetes mellitus without complication ([place]     Allergies:  Allergies  Allergen Reactions  . Penicillins Hives    MEDICATIONS: . amLODipine  5 mg Oral Daily  . aspirin EC  81 mg Oral Daily  . ertapenem  1 g Intravenous Q24H  . heparin  5,000 Units Subcutaneous Q8H  . insulin aspart  0-9 Units Subcutaneous TID WC  . metoprolol succinate  25 mg Oral Daily  .  pravastatin  20 mg Oral q1800    Social History  Substance Use Topics  . Smoking status: Never Smoker   . Smokeless tobacco: None  . Alcohol Use: No    Family History  Problem Relation Age of Onset  . Diabetes type II Other   . Hypertension Other      Review of Systems  Constitutional: positive for fever, chills, diaphoresis, activity change, appetite change, fatigue and unexpected weight change.  HENT: Negative for congestion, sore throat, rhinorrhea, sneezing, trouble swallowing and sinus pressure.  Eyes: Negative for photophobia and visual disturbance.  Respiratory: Negative for cough, chest tightness, shortness of breath, wheezing and stridor.  Cardiovascular: Negative for chest pain, palpitations and leg swelling.  Gastrointestinal: positive for nausea, vomiting, abdominal pain, diarrhea, constipation, blood in stool, abdominal distention and anal bleeding.  Genitourinary: Negative for dysuria, hematuria, flank pain and difficulty urinating.  Musculoskeletal: Negative for myalgias, back pain, joint swelling, arthralgias and gait problem.  Skin: Negative for color change, pallor, rash and wound.  Neurological: Negative for dizziness, tremors, weakness and light-headedness.  Hematological: Negative for adenopathy. Does not bruise/bleed easily.  Psychiatric/Behavioral: Negative for behavioral problems, confusion, sleep disturbance, dysphoric mood, decreased concentration and agitation.     OBJECTIVE: Temp:  [98.7 F (37.1 C)-99.1 F (37.3 C)] 98.8 F (37.1 C) (05/30 1347) Pulse Rate:  [80-90] 87 (05/30 1347) Resp:  [  18] 18 (05/30 0455) BP: (122-152)/(63-72) 148/67 mmHg (05/30 1347) SpO2:  [90 %-92 %] 92 % (05/30 1347) Physical Exam  Constitutional:  oriented to person, place, and time. appears well-developed and well-nourished. No distress.  HENT: [place]/AT, PERRLA, no scleral icterus Mouth/Throat: Oropharynx is clear and moist. No oropharyngeal exudate.  Cardiovascular:  Normal rate, regular rhythm and normal heart sounds. Exam reveals no gallop and no friction rub.  No murmur heard.  Pulmonary/Chest: Effort normal and breath sounds normal. No respiratory distress.  has no wheezes.  Neck = supple, no nuchal rigidity Abdominal: Soft. Bowel sounds are normal.  Mildly distension. There is no tenderness. RUQ drain, empty bulb Lymphadenopathy: no cervical adenopathy. No axillary adenopathy Neurological: alert and oriented to person, place, and time.  Skin: Skin is warm and dry. No rash noted. No erythema.  Psychiatric: a normal mood and affect.  behavior is normal.    LABS: Results for orders placed or performed during the hospital encounter of 01/10/16 (from the past 48 hour(s))  Glucose, capillary     Status: Abnormal   Collection Time: 01/13/16  4:39 PM  Result Value Ref Range   Glucose-Capillary 115 (H) 65 - 99 mg/dL   Comment 1 Notify RN   Glucose, capillary     Status: Abnormal   Collection Time: 01/13/16  9:51 PM  Result Value Ref Range   Glucose-Capillary 160 (H) 65 - 99 mg/dL  Procalcitonin     Status: None   Collection Time: 01/14/16  4:27 AM  Result Value Ref Range   Procalcitonin 2.73 ng/mL    Comment:        Interpretation: PCT > 2 ng/mL: Systemic infection (sepsis) is likely, unless other causes are known. (NOTE)         ICU PCT Algorithm               Non ICU PCT Algorithm    ----------------------------     ------------------------------         PCT < 0.25 ng/mL                 PCT < 0.1 ng/mL     Stopping of antibiotics            Stopping of antibiotics       strongly encouraged.               strongly encouraged.    ----------------------------     ------------------------------       PCT level decrease by               PCT < 0.25 ng/mL       >= 80% from peak PCT       OR PCT 0.25 - 0.5 ng/mL          Stopping of antibiotics                                             encouraged.     Stopping of antibiotics            encouraged.    ----------------------------     ------------------------------       PCT level decrease by              PCT >= 0.25 ng/mL       < 80% from peak PCT  AND PCT >= 0.5 ng/mL            Continuing antibiotics                                               encouraged.       Continuing antibiotics            encouraged.    ----------------------------     ------------------------------     PCT level increase compared          PCT > 0.5 ng/mL         with peak PCT AND          PCT >= 0.5 ng/mL             Escalation of antibiotics                                          strongly encouraged.      Escalation of antibiotics        strongly encouraged.   CBC     Status: Abnormal   Collection Time: 01/14/16  4:27 AM  Result Value Ref Range   WBC 24.5 (H) 4.0 - 10.5 K/uL   RBC 3.85 (L) 3.87 - 5.11 MIL/uL   Hemoglobin 11.7 (L) 12.0 - 15.0 g/dL   HCT 36.8 36.0 - 46.0 %   MCV 95.6 78.0 - 100.0 fL   MCH 30.4 26.0 - 34.0 pg   MCHC 31.8 30.0 - 36.0 g/dL   RDW 13.5 11.5 - 15.5 %   Platelets 233 150 - 400 K/uL  Basic metabolic panel     Status: Abnormal   Collection Time: 01/14/16  4:27 AM  Result Value Ref Range   Sodium 142 135 - 145 mmol/L   Potassium 3.5 3.5 - 5.1 mmol/L   Chloride 108 101 - 111 mmol/L   CO2 26 22 - 32 mmol/L   Glucose, Bld 126 (H) 65 - 99 mg/dL   BUN 17 6 - 20 mg/dL   Creatinine, Ser 1.46 (H) 0.44 - 1.00 mg/dL   Calcium 8.4 (L) 8.9 - 10.3 mg/dL   GFR calc non Af Amer 35 (L) >60 mL/min   GFR calc Af Amer 41 (L) >60 mL/min    Comment: (NOTE) The eGFR has been calculated using the CKD EPI equation. This calculation has not been validated in all clinical situations. eGFR's persistently <60 mL/min signify possible Chronic Kidney Disease.    Anion gap 8 5 - 15  Glucose, capillary     Status: Abnormal   Collection Time: 01/14/16  7:37 AM  Result Value Ref Range   Glucose-Capillary 113 (H) 65 - 99 mg/dL  Glucose, capillary     Status: Abnormal    Collection Time: 01/14/16 11:20 AM  Result Value Ref Range   Glucose-Capillary 170 (H) 65 - 99 mg/dL  Glucose, capillary     Status: Abnormal   Collection Time: 01/14/16  5:17 PM  Result Value Ref Range   Glucose-Capillary 111 (H) 65 - 99 mg/dL  Glucose, capillary     Status: Abnormal   Collection Time: 01/14/16  8:42 PM  Result Value Ref Range   Glucose-Capillary 144 (H) 65 - 99 mg/dL  CBC     Status: Abnormal  Collection Time: 01/15/16  5:01 AM  Result Value Ref Range   WBC 24.6 (H) 4.0 - 10.5 K/uL   RBC 3.76 (L) 3.87 - 5.11 MIL/uL   Hemoglobin 11.4 (L) 12.0 - 15.0 g/dL   HCT 35.7 (L) 36.0 - 46.0 %   MCV 94.9 78.0 - 100.0 fL   MCH 30.3 26.0 - 34.0 pg   MCHC 31.9 30.0 - 36.0 g/dL   RDW 13.6 11.5 - 15.5 %   Platelets 263 150 - 400 K/uL  Basic metabolic panel     Status: Abnormal   Collection Time: 01/15/16  5:01 AM  Result Value Ref Range   Sodium 140 135 - 145 mmol/L   Potassium 3.3 (L) 3.5 - 5.1 mmol/L   Chloride 109 101 - 111 mmol/L   CO2 22 22 - 32 mmol/L   Glucose, Bld 102 (H) 65 - 99 mg/dL   BUN 15 6 - 20 mg/dL   Creatinine, Ser 1.20 (H) 0.44 - 1.00 mg/dL   Calcium 8.3 (L) 8.9 - 10.3 mg/dL   GFR calc non Af Amer 45 (L) >60 mL/min   GFR calc Af Amer 52 (L) >60 mL/min    Comment: (NOTE) The eGFR has been calculated using the CKD EPI equation. This calculation has not been validated in all clinical situations. eGFR's persistently <60 mL/min signify possible Chronic Kidney Disease.    Anion gap 9 5 - 15  Glucose, capillary     Status: Abnormal   Collection Time: 01/15/16  7:44 AM  Result Value Ref Range   Glucose-Capillary 100 (H) 65 - 99 mg/dL  Glucose, capillary     Status: Abnormal   Collection Time: 01/15/16 11:19 AM  Result Value Ref Range   Glucose-Capillary 125 (H) 65 - 99 mg/dL   Comment 1 Notify RN     MICRO: 5/25 blood cx - ecoli ( amp R, amp/sub I, Sensitive to the rest) IMAGING: No results found.  Assessment/Plan:  70yo f with gangrenous  cholecystitis w/abscess adn associated ecoli bacteremia s/p lap chole with abdominal drain. Having leukocytosis post surgery but no other symptoms. procalcitonin also trending down. ecoli bacteremia is fairly sensitive only R to amp and amp/sub. She reports childhood allergies of hives, possible tight throat but tolerated ceftriaxone. She appears non-toxic and improving except for leukocytosis  -  Will keep on ertapenem for today to see if any change, can likely change her ceftaz plus metronidazole or back to ceftriaxone if no other causes found - if fever, recommend to get repeat blood cx and consider repeat imaging of abdomen to see if new fluid collection is present that could be drained - occasionally, Cdifficile starts with leukocytosis, but she denies any diarrhea to suggest infection presently. Would test if she meets criteria.  Elzie Rings [facility] Infect[facility] [phone]

## 2016-01-16 DIAGNOSIS — E785 Hyperlipidemia, unspecified: Secondary | ICD-10-CM

## 2016-01-16 DIAGNOSIS — M545 Low back pain: Secondary | ICD-10-CM

## 2016-01-16 DIAGNOSIS — K8050 Calculus of bile duct without cholangitis or cholecystitis without obstruction: Secondary | ICD-10-CM

## 2016-01-16 DIAGNOSIS — E876 Hypokalemia: Secondary | ICD-10-CM

## 2016-01-16 DIAGNOSIS — G8929 Other chronic pain: Secondary | ICD-10-CM

## 2016-01-16 DIAGNOSIS — E119 Type 2 diabetes mellitus without complications: Secondary | ICD-10-CM

## 2016-01-16 DIAGNOSIS — N179 Acute kidney failure, unspecified: Secondary | ICD-10-CM

## 2016-01-16 DIAGNOSIS — I10 Essential (primary) hypertension: Secondary | ICD-10-CM

## 2016-01-16 DIAGNOSIS — K810 Acute cholecystitis: Secondary | ICD-10-CM

## 2016-01-16 LAB — BASIC METABOLIC PANEL
ANION GAP: 8 (ref 5–15)
BUN: 14 mg/dL (ref 6–20)
CALCIUM: 8.2 mg/dL — AB (ref 8.9–10.3)
CO2: 25 mmol/L (ref 22–32)
CREATININE: 1.27 mg/dL — AB (ref 0.44–1.00)
Chloride: 105 mmol/L (ref 101–111)
GFR calc Af Amer: 48 mL/min — ABNORMAL LOW (ref >60)
GFR, EST NON AFRICAN AMERICAN: 42 mL/min — AB (ref >60)
GLUCOSE: 102 mg/dL — AB (ref 65–99)
Potassium: 3.4 mmol/L — ABNORMAL LOW (ref 3.5–5.1)
Sodium: 138 mmol/L (ref 135–145)

## 2016-01-16 LAB — CBC
HCT: 36.4 % (ref 36.0–46.0)
HEMOGLOBIN: 11.8 g/dL — AB (ref 12.0–15.0)
MCH: 30.1 pg (ref 26.0–34.0)
MCHC: 32.4 g/dL (ref 30.0–36.0)
MCV: 92.9 fL (ref 78.0–100.0)
PLATELETS: 323 10*3/uL (ref 150–400)
RBC: 3.92 MIL/uL (ref 3.87–5.11)
RDW: 13.4 % (ref 11.5–15.5)
WBC: 27.1 10*3/uL — ABNORMAL HIGH (ref 4.0–10.5)

## 2016-01-16 LAB — GLUCOSE, CAPILLARY
GLUCOSE-CAPILLARY: 95 mg/dL (ref 65–99)
GLUCOSE-CAPILLARY: 102 mg/dL — AB (ref 65–99)
Glucose-Capillary: 159 mg/dL — ABNORMAL HIGH (ref 65–99)
Glucose-Capillary: 127 mg/dL — ABNORMAL HIGH (ref 65–99)

## 2016-01-16 LAB — MAGNESIUM: MAGNESIUM: 1.4 mg/dL — AB (ref 1.7–2.4)

## 2016-01-16 MED ORDER — MAGNESIUM SULFATE 2 GM/50ML IV SOLN
2.0000 g | Freq: Once | INTRAVENOUS | Status: AC
  Administered 2016-01-16: 2 g via INTRAVENOUS
  Filled 2016-01-16: qty 50

## 2016-01-16 MED ORDER — CEFTRIAXONE SODIUM 2 G IJ SOLR
2.0000 g | INTRAMUSCULAR | Status: DC
  Administered 2016-01-16 – 2016-01-18 (×3): 2 g via INTRAVENOUS
  Filled 2016-01-16 (×4): qty 2

## 2016-01-16 NOTE — Progress Notes (Signed)
Patient ID: Angel Gonzalez, female   DOB: [birth year], 70 y.o.   MRN: [id number]      [facility]      [address] [place], [place] [address]    Phone: [phone] FAX: [phone]     Subjective: Tolerating POs, ambulating, having BMs, no diarrhea.  Afebrile.  VSS.  WBC continues to increase 24k--->27k.   Had a long discussion with the patient and daughter, frustrated about progress.   Objective:  Vital signs:  Filed Vitals:   01/15/16 0455 01/15/16 1347 01/15/16 2147 01/16/16 0617  BP: 152/72 148/67 139/71 154/78  Pulse: 90 87 103 99  Temp: 99.1 F (37.3 C) 98.8 F (37.1 C) 99.6 F (37.6 C) 98.3 F (36.8 C)  TempSrc: Oral Oral Oral Oral  Resp: 18  18   Height:      Weight:      SpO2: 91% 92% 94% 92%    Last BM Date: 01/10/16  Intake/Output   Yesterday:  05/30 0701 - 05/31 0700 In: 2440 [P.O.:1060; I.V.:1380] Out: 1884 [ZYSAY:3016; Drains:15] This shift:    I/O last 3 completed shifts: In: 4058 [P.O.:1420; I.V.:2638] Out: 1705 [Urine:1675; Drains:30]     Physical Exam: General: Pt awake/alert/oriented x4 in no acute distress  Abdomen: Soft. Nondistended. Mildly tender at incisions only. JP drain with serosanguinous output. No evidence of peritonitis. No incarcerated hernias.    Problem List:   Principal Problem:   Cholecystitis, acute s/p lap chole 01/12/16 Active Problems:   Essential hypertension   Hyperlipidemia   Non-insulin dependent type 2 diabetes mellitus (HCC)   Biliary colic   Chronic low back pain   Hypokalemia   AKI (acute kidney injury) ([place])   Acute gangrenous cholecystitis    Results:   Labs: Results for orders placed or performed during the hospital encounter of 01/10/16 (from the past 48 hour(s))  Glucose, capillary     Status: Abnormal   Collection Time: 01/14/16 11:20 AM  Result Value Ref Range   Glucose-Capillary 170 (H) 65 - 99 mg/dL  Glucose, capillary      Status: Abnormal   Collection Time: 01/14/16  5:17 PM  Result Value Ref Range   Glucose-Capillary 111 (H) 65 - 99 mg/dL  Glucose, capillary     Status: Abnormal   Collection Time: 01/14/16  8:42 PM  Result Value Ref Range   Glucose-Capillary 144 (H) 65 - 99 mg/dL  CBC     Status: Abnormal   Collection Time: 01/15/16  5:01 AM  Result Value Ref Range   WBC 24.6 (H) 4.0 - 10.5 K/uL   RBC 3.76 (L) 3.87 - 5.11 MIL/uL   Hemoglobin 11.4 (L) 12.0 - 15.0 g/dL   HCT 35.7 (L) 36.0 - 46.0 %   MCV 94.9 78.0 - 100.0 fL   MCH 30.3 26.0 - 34.0 pg   MCHC 31.9 30.0 - 36.0 g/dL   RDW 13.6 11.5 - 15.5 %   Platelets 263 150 - 400 K/uL  Basic metabolic panel     Status: Abnormal   Collection Time: 01/15/16  5:01 AM  Result Value Ref Range   Sodium 140 135 - 145 mmol/L   Potassium 3.3 (L) 3.5 - 5.1 mmol/L   Chloride 109 101 - 111 mmol/L   CO2 22 22 - 32 mmol/L   Glucose, Bld 102 (H) 65 - 99 mg/dL   BUN 15 6 - 20 mg/dL   Creatinine, Ser 1.20 (H) 0.44 - 1.00 mg/dL  Calcium 8.3 (L) 8.9 - 10.3 mg/dL   GFR calc non Af Amer 45 (L) >60 mL/min   GFR calc Af Amer 52 (L) >60 mL/min    Comment: (NOTE) The eGFR has been calculated using the CKD EPI equation. This calculation has not been validated in all clinical situations. eGFR's persistently <60 mL/min signify possible Chronic Kidney Disease.    Anion gap 9 5 - 15  Glucose, capillary     Status: Abnormal   Collection Time: 01/15/16  7:44 AM  Result Value Ref Range   Glucose-Capillary 100 (H) 65 - 99 mg/dL  Glucose, capillary     Status: Abnormal   Collection Time: 01/15/16 11:19 AM  Result Value Ref Range   Glucose-Capillary 125 (H) 65 - 99 mg/dL   Comment 1 Notify RN   Glucose, capillary     Status: None   Collection Time: 01/15/16  5:07 PM  Result Value Ref Range   Glucose-Capillary 69 65 - 99 mg/dL  Glucose, capillary     Status: None   Collection Time: 01/15/16  5:56 PM  Result Value Ref Range   Glucose-Capillary 83 65 - 99 mg/dL    Comment 1 Notify RN   Glucose, capillary     Status: Abnormal   Collection Time: 01/15/16  9:50 PM  Result Value Ref Range   Glucose-Capillary 130 (H) 65 - 99 mg/dL  Basic metabolic panel     Status: Abnormal   Collection Time: 01/16/16  4:56 AM  Result Value Ref Range   Sodium 138 135 - 145 mmol/L   Potassium 3.4 (L) 3.5 - 5.1 mmol/L   Chloride 105 101 - 111 mmol/L   CO2 25 22 - 32 mmol/L   Glucose, Bld 102 (H) 65 - 99 mg/dL   BUN 14 6 - 20 mg/dL   Creatinine, Ser 1.27 (H) 0.44 - 1.00 mg/dL   Calcium 8.2 (L) 8.9 - 10.3 mg/dL   GFR calc non Af Amer 42 (L) >60 mL/min   GFR calc Af Amer 48 (L) >60 mL/min    Comment: (NOTE) The eGFR has been calculated using the CKD EPI equation. This calculation has not been validated in all clinical situations. eGFR's persistently <60 mL/min signify possible Chronic Kidney Disease.    Anion gap 8 5 - 15  CBC     Status: Abnormal   Collection Time: 01/16/16  4:56 AM  Result Value Ref Range   WBC 27.1 (H) 4.0 - 10.5 K/uL   RBC 3.92 3.87 - 5.11 MIL/uL   Hemoglobin 11.8 (L) 12.0 - 15.0 g/dL   HCT 36.4 36.0 - 46.0 %   MCV 92.9 78.0 - 100.0 fL   MCH 30.1 26.0 - 34.0 pg   MCHC 32.4 30.0 - 36.0 g/dL   RDW 13.4 11.5 - 15.5 %   Platelets 323 150 - 400 K/uL  Magnesium     Status: Abnormal   Collection Time: 01/16/16  4:56 AM  Result Value Ref Range   Magnesium 1.4 (L) 1.7 - 2.4 mg/dL  Glucose, capillary     Status: None   Collection Time: 01/16/16  7:51 AM  Result Value Ref Range   Glucose-Capillary 95 65 - 99 mg/dL   Comment 1 Notify RN     Imaging / Studies: No results found.  Medications / Allergies:  Scheduled Meds: . amLODipine  5 mg Oral Daily  . aspirin EC  81 mg Oral Daily  . ertapenem  1 g Intravenous Q24H  . heparin  5,000 Units Subcutaneous Q8H  . insulin aspart  0-9 Units Subcutaneous TID WC  . metoprolol succinate  25 mg Oral Daily  . pravastatin  20 mg Oral q1800   Continuous Infusions: . sodium chloride 75 mL/hr at  01/16/16 0541   PRN Meds:.sodium chloride, acetaminophen **OR** acetaminophen, alum & mag hydroxide-simeth, bisacodyl, HYDROcodone-acetaminophen, HYDROmorphone (DILAUDID) injection, ondansetron **OR** ondansetron (ZOFRAN) IV, polyethylene glycol  Antibiotics: Anti-infectives    Start     Dose/Rate Route Frequency Ordered Stop   01/14/16 0930  ertapenem (INVANZ) 1 g in sodium chloride 0.9 % 50 mL IVPB     1 g 100 mL/hr over 30 Minutes Intravenous Every 24 hours 01/14/16 0842     01/10/16 2330  cefTRIAXone (ROCEPHIN) 2 g in dextrose 5 % 50 mL IVPB  Status:  Discontinued     2 g 100 mL/hr over 30 Minutes Intravenous Every 24 hours 01/10/16 2310 01/14/16 0842        Assessment/Plan Gangrenous cholecystitis with abscess POD#4 laparoscopic cholecystectomy with IOC---Dr. Cornett -WBC ncreased, jp drain remains serosanguinous, non tender, afebrile.  Unsure of whats driving the leukocytosis.  At this point I think it too early to scan her abdomen, ideally 5-7 days post op. -mobilize, IS FEN-no issues VTE prophylaxis-SCD/heparin ID-rocephin. Invanz 5/29-->. BC 5/25 with e coli bacteremia. Sensitivities-rocephin sensitive. Repeat BCx 5/8---NTD.  atbx per ID  Erby Pian, [facility] Pager [phone](7A-4:30P) For consults and floor pages call [phone](7A-4:30P)  01/16/2016 8:12 AM

## 2016-01-16 NOTE — Progress Notes (Signed)
PROGRESS NOTE    Angel Gonzalez  ZOX:[id number] DOB: 07-29-1946 DOA: 01/10/2016 [facility]   Brief Narrative:  HPI On 01/10/2016 by Dr. Odie Seraimothy Opyd Angel Gonzalez is a 70 y.o. female with medical history significant for hypertension, hyperlipidemia, type 2 diabetes mellitus, and chronic low back pain who presents as a direct admission, sent by Judee ClaraJulie Johnson, NP for evaluation of biliary colic. Patient reports being in her usual state of health until 01/06/2016 when there was insidious development of a dull ache in the right upper quadrant. This pain progressed over the course of approximately 30 minutes and became accompanied by nausea and vomiting before spontaneously resolving. Since that time, she's had recurrences in this pain with increasing frequency.She now reports a constant, dull ache at the right upper quadrant that is tolerable while at rest, but worse while moving around. She denies noticing any change in her symptoms with oral intake but notes that she has not been up to hold much down due to the nausea and vomiting. She was evaluated for these complaints in the [facility] emergency department on 01/06/2016 with CT scan that demonstrated a 12 mm stone at the gallbladder neck, but per the provider notes, there was no evidence of acute cholecystitis and she was discharged home to follow-up with her primary care provider for abdominal ultrasound in [place] town [place]. [name], [name], being told she would need cholecystectomy, and was advised to present to registration [facility] for direct admission.  Patient [facility] where she is found to be afebrile, saturating well on room air, and with vital signs stable. She denies any pain currently, noting that she had some earlier this morning. She is in no apparent distress, nontoxic in appearance. She denies any pain  currently, denies any recent chest pain, palpitations, dyspnea, or cough. There is been non-bloody vomiting, but no diarrhea. She denies any urinary symptoms and denies recent antibiotic use. She'll be placed in observation status for further evaluation and management of suspected biliary colic.  Assessment & Plan   Biliary colic / Acute cholecystitis / leukocytosis -Abdominal ultrasound significant for acute cholecystitis -Normal liver function enzymes, normal lipase -S/P lap cholecystectomy 5/27 and drain placement -Worsening leukocytosis so surgery changed abx from rocephin to Invanz 01/14/2016 -Diet as tolerated   E.Coli and Enterobacteriaceaebacteremia -Blood cultures on admission growing E.Coli and Enterobacteriaceae species -Repeat blood cultures showed no growth so far -Antibiotics changed to Invanz due to worsening leukocytosis   Mild troponin elevation  -Likely secondary to demand ischemia in the setting of acute cholecystitis -Troponin 0.11, 0.03, 0.04 -No acute ischemic changes on admission 12-lead EKG -Echocardiogram EF 55-60%, grade 1 diastolic dysfunction -Cardiology consulted and appreciated. Recommended holding ACEI until renal function improves, outpatient follow up in 1-3 months, Aspirin 81mg   Essential hypertension -Continue metoprolol 25 mg daily and Norvasc 5 mg daily   Hyperlipidemia -Continue Pravachol  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 (HCC) -Hemoglobin A1c is 6.4 -Continue insulin sliding scale and CBG monitoring   Hypokalemia/Hypomagnesemia -Likely due to GI losses -Continue to replace -Magnesium 1.4, will replace -Continue to monitor BMP and Magnesium   Acute kidney injury -Creatinine 2.3, improving with hydration  -Cr on 5/21 was 1.4 but no other values for comparison  -Cr improving with hydration -Cr 1.27 today  DVT Prophylaxis  Heparin  Code Status: Full  Family  Communication:  Daughter at  bedside  Disposition Plan: Admitted   Consultants General surgery Cardiology Infectious disease  Procedures  Echocardiogram Lap Cholecystectomy   Antibiotics   Anti-infectives    Start     Dose/Rate Route Frequency Ordered Stop   01/14/16 0930  ertapenem (INVANZ) 1 g in sodium chloride 0.9 % 50 mL IVPB     1 g 100 mL/hr over 30 Minutes Intravenous Every 24 hours 01/14/16 0842     01/10/16 2330  cefTRIAXone (ROCEPHIN) 2 g in dextrose 5 % 50 mL IVPB  Status:  Discontinued     2 g 100 mL/hr over 30 Minutes Intravenous Every 24 hours 01/10/16 2310 01/14/16 0842      Subjective:   Angel Gonzalez seen and examined today.  Patient has no complaints today. Denies any chest pain, shortness breath, nausea vomiting, abdominal pain at this time.  Objective:   Filed Vitals:   01/15/16 0455 01/15/16 1347 01/15/16 2147 01/16/16 0617  BP: 152/72 148/67 139/71 154/78  Pulse: 90 87 103 99  Temp: 99.1 F (37.3 C) 98.8 F (37.1 C) 99.6 F (37.6 C) 98.3 F (36.8 C)  TempSrc: Oral Oral Oral Oral  Resp: 18  18   Height:      Weight:      SpO2: 91% 92% 94% 92%    Intake/Output Summary (Last 24 hours) at 01/16/16 1326 Last data filed at 01/16/16 0850  Gross per 24 hour  Intake   2680 ml  Output   2190 ml  Net    490 ml   Filed Weights   01/10/16 1905  Weight: 74.39 kg (164 lb)    Exam  General: Well developed, well nourished, NAD, appears stated age  HEENT: NCAT, mucous membranes moist.   Cardiovascular: S1 S2 auscultated, RRR. No murmurs  Respiratory: Clear to auscultation bilaterally   Abdomen: Soft, nontender, nondistended, + bowel sounds  Extremities: warm dry without cyanosis clubbing or edema  Neuro: AAOx3, nonfocal  Psych: Normal affect and demeanor   Data Reviewed: I have personally reviewed following labs and imaging studies  CBC:  Recent Labs Lab 01/10/16 2219 01/13/16 0917 01/14/16 0427 01/15/16 0501 01/16/16 0456  WBC 15.7* 18.6* 24.5* 24.6*  27.1*  NEUTROABS 11.7*  --   --   --   --   HGB 12.9 12.2 11.7* 11.4* 11.8*  HCT 38.7 37.6 36.8 35.7* 36.4  MCV 92.6 94.0 95.6 94.9 92.9  PLT 186 241 233 263 323   Basic Metabolic Panel:  Recent Labs Lab 01/10/16 2219 01/11/16 0827 01/13/16 0917 01/14/16 0427 01/15/16 0501 01/16/16 0456  NA 134* 137 142 142 140 138  K 2.8* 3.6 3.8 3.5 3.3* 3.4*  CL 98* 102 108 108 109 105  CO2 28 25 26 26 22 25   GLUCOSE 130* 134* 119* 126* 102* 102*  BUN 39* 32* 21* 17 15 14   CREATININE 2.30* 1.98* 1.49* 1.46* 1.20* 1.27*  CALCIUM 8.7* 8.7* 8.7* 8.4* 8.3* 8.2*  MG 2.1  --   --   --   --  1.4*   GFR: Estimated Creatinine Clearance: 39.8 mL/min (by C-G formula based on Cr of 1.27). Liver Function Tests:  Recent Labs Lab 01/10/16 2219 01/11/16 1119  AST 33 56*  ALT 27 42  ALKPHOS 86 103  BILITOT 0.7 0.8  PROT 6.8 6.8  ALBUMIN 2.6* 2.5*    Recent Labs Lab 01/10/16 2219  LIPASE 20  AMYLASE 46   No results for input(s): AMMONIA in the  last 168 hours. Coagulation Profile:  Recent Labs Lab 01/10/16 2219  INR 1.22   Cardiac Enzymes:  Recent Labs Lab 01/10/16 2219 01/11/16 0030 01/11/16 0827 01/11/16 1152  TROPONINI 0.11* 0.03 0.04* 0.06*   BNP (last 3 results) No results for input(s): PROBNP in the last 8760 hours. HbA1C: No results for input(s): HGBA1C in the last 72 hours. CBG:  Recent Labs Lab 01/15/16 1707 01/15/16 1756 01/15/16 2150 01/16/16 0751 01/16/16 1129  GLUCAP 69 83 130* 95 159*   Lipid Profile: No results for input(s): CHOL, HDL, LDLCALC, TRIG, CHOLHDL, LDLDIRECT in the last 72 hours. Thyroid Function Tests: No results for input(s): TSH, T4TOTAL, FREET4, T3FREE, THYROIDAB in the last 72 hours. Anemia Panel: No results for input(s): VITAMINB12, FOLATE, FERRITIN, TIBC, IRON, RETICCTPCT in the last 72 hours. Urine analysis:    Component Value Date/Time   COLORURINE YELLOW 01/10/2016 2353   APPEARANCEUR CLEAR 01/10/2016 2353   LABSPEC 1.026  01/10/2016 2353   PHURINE 5.5 01/10/2016 2353   GLUCOSEU >1000* 01/10/2016 2353   HGBUR NEGATIVE 01/10/2016 2353   BILIRUBINUR NEGATIVE 01/10/2016 2353   KETONESUR NEGATIVE 01/10/2016 2353   PROTEINUR NEGATIVE 01/10/2016 2353   NITRITE NEGATIVE 01/10/2016 2353   LEUKOCYTESUR NEGATIVE 01/10/2016 2353   Sepsis Labs: (procalcitonin:4,lacticidven:4)  ) Recent Results (from the past 240 hour(s))  Culture, blood (routine x 2)     Status: Abnormal   Collection Time: 01/10/16 11:20 PM  Result Value Ref Range Status   Specimen Description BLOOD LEFT ARM  Final   Special Requests BOTTLES DRAWN AEROBIC AND ANAEROBIC  Final   Culture  Setup Time   Final    GRAM NEGATIVE RODS IN BOTH AEROBIC AND ANAEROBIC BOTTLES CRITICAL RESULT CALLED TO, READ BACK BY AND VERIFIED WITH: Despina Pole AT 1509 01/11/16 BY L BENFIELD    Culture ESCHERICHIA COLI (A)  Final   Report Status 01/13/2016 FINAL  Final   Organism ID, Bacteria ESCHERICHIA COLI  Final      Susceptibility   Escherichia coli - MIC*    AMPICILLIN >=32 RESISTANT Resistant     CEFAZOLIN 8 SENSITIVE Sensitive     CEFEPIME <=1 SENSITIVE Sensitive     CEFTAZIDIME <=1 SENSITIVE Sensitive     CEFTRIAXONE <=1 SENSITIVE Sensitive     CIPROFLOXACIN <=0.25 SENSITIVE Sensitive     GENTAMICIN <=1 SENSITIVE Sensitive     IMIPENEM <=0.25 SENSITIVE Sensitive     TRIMETH/SULFA <=20 SENSITIVE Sensitive     AMPICILLIN/SULBACTAM 16 INTERMEDIATE Intermediate     PIP/TAZO <=4 SENSITIVE Sensitive     * ESCHERICHIA COLI  Blood Culture ID Panel (Reflexed)     Status: Abnormal   Collection Time: 01/10/16 11:20 PM  Result Value Ref Range Status   Enterococcus species NOT DETECTED NOT DETECTED Final   Vancomycin resistance NOT DETECTED NOT DETECTED Final   Listeria monocytogenes NOT DETECTED NOT DETECTED Final   Staphylococcus species NOT DETECTED NOT DETECTED Final   Staphylococcus aureus NOT DETECTED NOT DETECTED Final   Methicillin  resistance NOT DETECTED NOT DETECTED Final   Streptococcus species NOT DETECTED NOT DETECTED Final   Streptococcus agalactiae NOT DETECTED NOT DETECTED Final   Streptococcus pneumoniae NOT DETECTED NOT DETECTED Final   Streptococcus pyogenes NOT DETECTED NOT DETECTED Final   Acinetobacter baumannii NOT DETECTED NOT DETECTED Final   Enterobacteriaceae species DETECTED (A) NOT DETECTED Final    Comment: CRITICAL RESULT CALLED TO, READ BACK BY AND VERIFIED WITH: M MANCHERIL,PHARMD AT 1509 01/11/16 BY L BENFIELD  Enterobacter cloacae complex NOT DETECTED NOT DETECTED Final   Escherichia coli DETECTED (A) NOT DETECTED Final    Comment: CRITICAL RESULT CALLED TO, READ BACK BY AND VERIFIED WITH: Despina Pole AT 1509 01/11/16 BY L BENFIELD    Klebsiella oxytoca NOT DETECTED NOT DETECTED Final   Klebsiella pneumoniae NOT DETECTED NOT DETECTED Final   Proteus species NOT DETECTED NOT DETECTED Final   Serratia marcescens NOT DETECTED NOT DETECTED Final   Carbapenem resistance NOT DETECTED NOT DETECTED Final   Haemophilus influenzae NOT DETECTED NOT DETECTED Final   Neisseria meningitidis NOT DETECTED NOT DETECTED Final   Pseudomonas aeruginosa NOT DETECTED NOT DETECTED Final   Candida albicans NOT DETECTED NOT DETECTED Final   Candida glabrata NOT DETECTED NOT DETECTED Final   Candida krusei NOT DETECTED NOT DETECTED Final   Candida parapsilosis NOT DETECTED NOT DETECTED Final   Candida tropicalis NOT DETECTED NOT DETECTED Final  Culture, blood (routine x 2)     Status: Abnormal   Collection Time: 01/10/16 11:30 PM  Result Value Ref Range Status   Specimen Description BLOOD RIGHT ARM  Final   Special Requests BOTTLES DRAWN AEROBIC AND ANAEROBIC  Final   Culture  Setup Time   Final    GRAM NEGATIVE RODS IN BOTH AEROBIC AND ANAEROBIC BOTTLES CRITICAL RESULT CALLED TO, READ BACK BY AND VERIFIED WITH: M MANCHERIL,PHARMD AT 1509 01/11/16 BY L BENFIELD    Culture (A)  Final     ESCHERICHIA COLI SUSCEPTIBILITIES PERFORMED ON PREVIOUS CULTURE WITHIN THE LAST 5 DAYS.    Report Status 01/13/2016 FINAL  Final  Surgical pcr screen     Status: None   Collection Time: 01/11/16  4:25 PM  Result Value Ref Range Status   MRSA, PCR NEGATIVE NEGATIVE Final   Staphylococcus aureus NEGATIVE NEGATIVE Final    Comment:        The Xpert SA Assay (FDA approved for NASAL specimens in patients over 35 years of age), is one component of a comprehensive surveillance program.  Test performance has been validated by [facility] for patients greater than or equal to 17 year old. It is not intended to diagnose infection nor to guide or monitor treatment.   Culture, blood (routine x 2)     Status: None (Preliminary result)   Collection Time: 01/13/16  9:22 AM  Result Value Ref Range Status   Specimen Description BLOOD LEFT ARM  Final   Special Requests BOTTLES DRAWN AEROBIC AND ANAEROBIC 10CC  Final   Culture NO GROWTH 3 DAYS  Final   Report Status PENDING  Incomplete  Culture, blood (routine x 2)     Status: None (Preliminary result)   Collection Time: 01/13/16  9:33 AM  Result Value Ref Range Status   Specimen Description BLOOD LEFT HAND  Final   Special Requests IN PEDIATRIC BOTTLE 1CC  Final   Culture NO GROWTH 3 DAYS  Final   Report Status PENDING  Incomplete      Radiology Studies: No results found.   Scheduled Meds: . amLODipine  5 mg Oral Daily  . aspirin EC  81 mg Oral Daily  . ertapenem  1 g Intravenous Q24H  . heparin  5,000 Units Subcutaneous Q8H  . insulin aspart  0-9 Units Subcutaneous TID WC  . magnesium sulfate 1 - 4 g bolus IVPB  2 g Intravenous Once  . metoprolol succinate  25 mg Oral Daily  . pravastatin  20 mg Oral q1800   Continuous Infusions: .  sodium chloride 75 mL/hr at 01/16/16 0541     LOS: 6 days   Time Spent in minutes   30 minutes  Olive Zmuda D.O. on 01/16/2016 at 1:26 PM  Between 7am to 7pm - Pager -  [phone]  After 7pm go to [email or web address] - password [email or web address]  And look for the night coverage person covering for me after hours  [facility] Office  [phone]

## 2016-01-16 NOTE — Care Management Important Message (Signed)
Important Message  Patient Details  Name: Angel Gonzalez MRN: [id number] Date of Birth: January 18, 1946   Medicare Important Message Given:  Yes    Bernadette HoitShoffner, Hadley Detloff Coleman 01/16/2016, 9:31 AM

## 2016-01-16 NOTE — Progress Notes (Signed)
[facility]    Date of Admission:  01/10/2016   Total days of antibiotics 7        Day 3 ertapenem           ID: Angel Gonzalez is a 70 y.o. female with with gangrenous cholecystitis w/abscess adn associated ecoli bacteremia s/p lap chole with abdominal drain on 5/27. Having leukocytosis post surgery but no other symptoms. procalcitonin also trending down. ecoli bacteremia is fairly sensitive only R to amp and amp/sub. She reports childhood allergies of hives, possible tight throat but tolerated ceftriaxone. She appears non-toxic and improving except for leukocytosis Principal Problem:   Cholecystitis, acute s/p lap chole 01/12/16 Active Problems:   Essential hypertension   Hyperlipidemia   Non-insulin dependent type 2 diabetes mellitus (HCC)   Biliary colic   Chronic low back pain   Hypokalemia   AKI (acute kidney injury) (HCC)   Acute gangrenous cholecystitis    Subjective: Afebrile, had one bowel movement today. Some slight discomfort from drain  Medications:  . amLODipine  5 mg Oral Daily  . aspirin EC  81 mg Oral Daily  . cefTRIAXone (ROCEPHIN)  IV  2 g Intravenous Q24H  . heparin  5,000 Units Subcutaneous Q8H  . insulin aspart  0-9 Units Subcutaneous TID WC  . metoprolol succinate  25 mg Oral Daily  . pravastatin  20 mg Oral q1800    Objective: Vital signs in last 24 hours: Temp:  [98.3 F (36.8 C)-99.6 F (37.6 C)] 98.7 F (37.1 C) (05/31 1424) Pulse Rate:  [90-103] 90 (05/31 1424) Resp:  [18] 18 (05/30 2147) BP: (125-154)/(66-78) 125/66 mmHg (05/31 1424) SpO2:  [92 %-94 %] 94 % (05/31 1424) Physical Exam  Constitutional:  oriented to person, place, and time. appears well-developed and well-nourished. No distress. Sitting in chair HENT: [place]/AT, PERRLA, no scleral icterus Mouth/Throat: Oropharynx is clear and moist. No oropharyngeal exudate.  Cardiovascular: Normal rate, regular rhythm and normal heart sounds. Exam reveals no gallop  and no friction rub.  No murmur heard.  Pulmonary/Chest: Effort normal and breath sounds normal. No respiratory distress.  has no wheezes.  Neck = supple, no nuchal rigidity Abdominal: Soft. Bowel sounds are normal.  Slightly distension. Mild tenderness at incision. Lymphadenopathy: no cervical adenopathy. No axillary adenopathy Neurological: alert and oriented to person, place, and time.  Skin: Skin is warm and dry. No rash noted. No erythema.  Psychiatric: a normal mood and affect.  behavior is normal.    Lab Results  Recent Labs  01/15/16 0501 01/16/16 0456  WBC 24.6* 27.1*  HGB 11.4* 11.8*  HCT 35.7* 36.4  NA 140 138  K 3.3* 3.4*  CL 109 105  CO2 22 25  BUN 15 14  CREATININE 1.20* 1.27*   Microbiology: 5/28 blood cx ngtd 5/25 blood cx ecoli Studies/Results: No results found.   Assessment/Plan: 70 y.o. female with with gangrenous cholecystitis w/abscess and associated ecoli bacteremia s/p lap chole with abdominal drain on 5/27. Having leukocytosis post surgery but no other symptoms. procalcitonin also trending down. ecoli bacteremia is fairly sensitive only R to amp and amp/sub. She reports childhood allergies of hives, possible tight throat but tolerated ceftriaxone. She appears non-toxic and improving except for leukocytosis. Not sure what is driving her leukocytosis but appears that she recovering from her surgery. She denies any cough, dysuria, diarrhea to suggest secondary infection  - recommend to narrow abtx to ceftriaxone 2gm iv daily - will get cbc with  diff in the morning to see if having improvement   Dr Luciana Axe to see tomorrow   [facility] Cell: [phone] Pager: [phone]  01/16/2016, 5:24 PM

## 2016-01-17 ENCOUNTER — Inpatient Hospital Stay (HOSPITAL_COMMUNITY): Payer: Medicare Other

## 2016-01-17 ENCOUNTER — Encounter (HOSPITAL_COMMUNITY): Payer: Self-pay | Admitting: Radiology

## 2016-01-17 DIAGNOSIS — D72829 Elevated white blood cell count, unspecified: Secondary | ICD-10-CM

## 2016-01-17 DIAGNOSIS — R7881 Bacteremia: Secondary | ICD-10-CM

## 2016-01-17 LAB — DIFFERENTIAL
BASOS ABS: 0 10*3/uL (ref 0.0–0.1)
Basophils Relative: 0 %
EOS ABS: 0 10*3/uL (ref 0.0–0.7)
EOS PCT: 0 %
LYMPHS ABS: 2.7 10*3/uL (ref 0.7–4.0)
Lymphocytes Relative: 10 %
MONO ABS: 2.5 10*3/uL — AB (ref 0.1–1.0)
Monocytes Relative: 9 %
Neutro Abs: 22.2 10*3/uL — ABNORMAL HIGH (ref 1.7–7.7)
Neutrophils Relative %: 81 %
SMEAR REVIEW: ADEQUATE

## 2016-01-17 LAB — CBC
HEMATOCRIT: 34.3 % — AB (ref 36.0–46.0)
Hemoglobin: 11.3 g/dL — ABNORMAL LOW (ref 12.0–15.0)
MCH: 30.7 pg (ref 26.0–34.0)
MCHC: 32.9 g/dL (ref 30.0–36.0)
MCV: 93.2 fL (ref 78.0–100.0)
PLATELETS: 307 10*3/uL (ref 150–400)
RBC: 3.68 MIL/uL — AB (ref 3.87–5.11)
RDW: 13.5 % (ref 11.5–15.5)
WBC: 27.4 10*3/uL — AB (ref 4.0–10.5)

## 2016-01-17 LAB — BASIC METABOLIC PANEL
Anion gap: 7 (ref 5–15)
BUN: 12 mg/dL (ref 6–20)
CHLORIDE: 106 mmol/L (ref 101–111)
CO2: 24 mmol/L (ref 22–32)
CREATININE: 1.22 mg/dL — AB (ref 0.44–1.00)
Calcium: 8 mg/dL — ABNORMAL LOW (ref 8.9–10.3)
GFR, EST AFRICAN AMERICAN: 51 mL/min — AB (ref >60)
GFR, EST NON AFRICAN AMERICAN: 44 mL/min — AB (ref >60)
Glucose, Bld: 102 mg/dL — ABNORMAL HIGH (ref 65–99)
POTASSIUM: 3.1 mmol/L — AB (ref 3.5–5.1)
SODIUM: 137 mmol/L (ref 135–145)

## 2016-01-17 LAB — GLUCOSE, CAPILLARY
GLUCOSE-CAPILLARY: 101 mg/dL — AB (ref 65–99)
GLUCOSE-CAPILLARY: 93 mg/dL (ref 65–99)
GLUCOSE-CAPILLARY: 86 mg/dL (ref 65–99)
GLUCOSE-CAPILLARY: 109 mg/dL — AB (ref 65–99)

## 2016-01-17 LAB — MAGNESIUM: MAGNESIUM: 1.7 mg/dL (ref 1.7–2.4)

## 2016-01-17 MED ORDER — DIATRIZOATE MEGLUMINE & SODIUM 66-10 % PO SOLN
ORAL | Status: AC
  Administered 2016-01-17: 13:00:00
  Filled 2016-01-17: qty 30

## 2016-01-17 MED ORDER — POTASSIUM CHLORIDE CRYS ER 20 MEQ PO TBCR
40.0000 meq | EXTENDED_RELEASE_TABLET | Freq: Once | ORAL | Status: AC
  Administered 2016-01-17: 40 meq via ORAL
  Filled 2016-01-17: qty 2

## 2016-01-17 MED ORDER — DIATRIZOATE MEGLUMINE & SODIUM 66-10 % PO SOLN
30.0000 mL | Freq: Once | ORAL | Status: AC
  Administered 2016-01-17: 30 mL via ORAL
  Filled 2016-01-17: qty 30

## 2016-01-17 MED ORDER — IOPAMIDOL (ISOVUE-300) INJECTION 61%
INTRAVENOUS | Status: AC
  Administered 2016-01-17: 100 mL
  Filled 2016-01-17: qty 100

## 2016-01-17 MED ORDER — MAGNESIUM SULFATE 2 GM/50ML IV SOLN
2.0000 g | Freq: Once | INTRAVENOUS | Status: AC
  Administered 2016-01-17: 2 g via INTRAVENOUS
  Filled 2016-01-17: qty 50

## 2016-01-17 NOTE — Progress Notes (Signed)
CT Contrast given at 1330 and 1430 by mouth.

## 2016-01-17 NOTE — Progress Notes (Signed)
PROGRESS NOTE    Angel Gonzalez  ZOX:[id number] DOB: 1946/07/05 DOA: 01/10/2016 PCP: Angel Gonzalez [facility]   Brief Narrative:  HPI On 01/10/2016 by Dr. Odie Gonzalez Angel Gonzalez is a 70 y.o. female with medical history significant for hypertension, hyperlipidemia, type 2 diabetes mellitus, and chronic low back pain who presents as a direct admission, sent by Angel Gonzalez for evaluation of biliary colic. Patient reports being in her usual state of health until 01/06/2016 when there was insidious development of a dull ache in the right upper quadrant. This pain progressed over the course of approximately 30 minutes and became accompanied by nausea and vomiting before spontaneously resolving. Since that time, she's had recurrences in this pain with increasing frequency.She now reports a constant, dull ache at the right upper quadrant that is tolerable while at rest, but worse while moving around. She denies noticing any change in her symptoms with oral intake but notes that she has not been up to hold much down due to the nausea and vomiting. She was evaluated for these complaints in the [facility] emergency department on 01/06/2016 with CT scan that demonstrated a 12 mm stone at the gallbladder neck, but per the provider notes, there was no evidence of acute cholecystitis and she was discharged home to follow-up with her primary care provider for abdominal ultrasound in her home town of [place]. Patient reports seeing Angel Gonzalez in the clinic, being told she would need cholecystectomy, and was advised to present to registration at [facility] for direct admission.  Patient is evaluated on the surgical unit at [facility] where she is found to be afebrile, saturating well on room air, and with vital signs stable. She denies any pain currently, noting that she had some earlier this morning. She is in no apparent distress, nontoxic in appearance. She denies any pain  currently, denies any recent chest pain, palpitations, dyspnea, or cough. There is been non-bloody vomiting, but no diarrhea. She denies any urinary symptoms and denies recent antibiotic use. She'll be placed in observation status for further evaluation and management of suspected biliary colic.  Assessment & Plan   Biliary colic / Acute cholecystitis / leukocytosis -Abdominal ultrasound significant for acute cholecystitis -Normal liver function enzymes, normal lipase -S/P lap cholecystectomy 5/27 and drain placement -Worsening leukocytosis so surgery changed abx from rocephin to Invanz 01/14/2016 -ID recommended narrowing antibiotics, and changing to rocephin on 01/16/2016 -WBC 27.4 today -Diet as tolerated  -General surgery ordered CT today  E.Coli and Enterobacteriaceaebacteremia -Blood cultures on admission growing E.Coli and Enterobacteriaceae species -Repeat blood cultures showed no growth so far -Antibiotics were changed to Invanz due to worsening leukocytosis  -Now on ceftriaxone  Mild troponin elevation  -Likely secondary to demand ischemia in the setting of acute cholecystitis -Troponin 0.11, 0.03, 0.04 -No acute ischemic changes on admission 12-lead EKG -Echocardiogram EF 55-60%, grade 1 diastolic dysfunction -Cardiology consulted and appreciated. Recommended holding ACEI until renal function improves, outpatient follow up in 1-3 months, Aspirin   Essential hypertension -Continue metoprolol 25 mg daily and Norvasc 5 mg daily   Hyperlipidemia -Continue Pravachol  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 (HCC) -Hemoglobin A1c is 6.4 -Continue insulin sliding scale and CBG monitoring   Hypokalemia/Hypomagnesemia -Likely due to GI losses -Continue to replace -Magnesium 1.7 today, continue to replace -Continue to monitor BMP and Magnesium   Acute kidney injury -Creatinine 2.3, improving with hydration  -Cr on 5/21 was 1.4 but  no other values for comparison  -Cr improving with hydration -Cr 1.22 today  DVT Prophylaxis  Heparin  Code Status: Full  Family Communication:  Daughter at bedside  Disposition Plan: Admitted. Pending CT.   Consultants General surgery Cardiology Infectious disease  Procedures  Echocardiogram Lap Cholecystectomy   Antibiotics   Anti-infectives    Start     Dose/Rate Route Frequency Ordered Stop   01/16/16 1800  cefTRIAXone (ROCEPHIN) 2 g in dextrose 5 % 50 mL IVPB     2 g 100 mL/hr over 30 Minutes Intravenous Every 24 hours 01/16/16 1724     01/14/16 0930  ertapenem (INVANZ) 1 g in sodium chloride 0.9 % 50 mL IVPB  Status:  Discontinued     1 g 100 mL/hr over 30 Minutes Intravenous Every 24 hours 01/14/16 0842 01/16/16 1723   01/10/16 2330  cefTRIAXone (ROCEPHIN) 2 g in dextrose 5 % 50 mL IVPB  Status:  Discontinued     2 g 100 mL/hr over 30 Minutes Intravenous Every 24 hours 01/10/16 2310 01/14/16 0842      Subjective:   Angel MimesNadine Gonzalez seen and examined today.  Patient has no complaints today and would like to go home.  Denies any chest pain, shortness breath, nausea vomiting, abdominal pain at this time.  Objective:   Filed Vitals:   01/16/16 0617 01/16/16 1424 01/16/16 2124 01/17/16 0451  BP: 154/78 125/66 138/76 146/81  Pulse: 99 90 94 97  Temp: 98.3 F (36.8 C) 98.7 F (37.1 C) 98.9 F (37.2 C) 98.1 F (36.7 C)  TempSrc: Oral Oral Oral Oral  Resp:   17 16  Height:      Weight:      SpO2: 92% 94% 90% 97%    Intake/Output Summary (Last 24 hours) at 01/17/16 1142 Last data filed at 01/17/16 0700  Gross per 24 hour  Intake   1451 ml  Output   2075 ml  Net   -624 ml   Filed Weights   01/10/16 1905  Weight: 74.39 kg (164 lb)    Exam  General: Well developed, well nourished, NAD  HEENT: NCAT, mucous membranes moist.   Cardiovascular: S1 S2 auscultated, RRR. No murmurs  Respiratory: Clear to auscultation bilaterally   Abdomen: Soft,  nontender, nondistended, + bowel sounds  Extremities: warm dry without cyanosis clubbing or edema  Neuro: AAOx3, nonfocal  Psych: Normal affect and demeanor, pleasant   Data Reviewed: I have personally reviewed following labs and imaging studies  CBC:  Recent Labs Lab 01/10/16 2219 01/13/16 0917 01/14/16 0427 01/15/16 0501 01/16/16 0456 01/17/16 0245  WBC 15.7* 18.6* 24.5* 24.6* 27.1* 27.4*  NEUTROABS 11.7*  --   --   --   --  22.2*  HGB 12.9 12.2 11.7* 11.4* 11.8* 11.3*  HCT 38.7 37.6 36.8 35.7* 36.4 34.3*  MCV 92.6 94.0 95.6 94.9 92.9 93.2  PLT 186 241 233 263 323 307   Basic Metabolic Panel:  Recent Labs Lab 01/10/16 2219  01/13/16 0917 01/14/16 0427 01/15/16 0501 01/16/16 0456 01/17/16 0245 01/17/16 0740  NA 134*  < > 142 142 140 138 137  --   K 2.8*  < > 3.8 3.5 3.3* 3.4* 3.1*  --   CL 98*  < > 108 108 109 105 106  --   CO2 28  < > 26 26 22 25 24   --   GLUCOSE 130*  < > 119* 126* 102* 102* 102*  --   BUN 39*  < >  21* 17 15 14 12   --   CREATININE 2.30*  < > 1.49* 1.46* 1.20* 1.27* 1.22*  --   CALCIUM 8.7*  < > 8.7* 8.4* 8.3* 8.2* 8.0*  --   MG 2.1  --   --   --   --  1.4*  --  1.7  < > = values in this interval not displayed. GFR: Estimated Creatinine Clearance: 41.5 mL/min (by C-G formula based on Cr of 1.22). Liver Function Tests:  Recent Labs Lab 01/10/16 2219 01/11/16 1119  AST 33 56*  ALT 27 42  ALKPHOS 86 103  BILITOT 0.7 0.8  PROT 6.8 6.8  ALBUMIN 2.6* 2.5*    Recent Labs Lab 01/10/16 2219  LIPASE 20  AMYLASE 46   No results for input(s): AMMONIA in the last 168 hours. Coagulation Profile:  Recent Labs Lab 01/10/16 2219  INR 1.22   Cardiac Enzymes:  Recent Labs Lab 01/10/16 2219 01/11/16 0030 01/11/16 0827 01/11/16 1152  TROPONINI 0.11* 0.03 0.04* 0.06*   BNP (last 3 results) No results for input(s): PROBNP in the last 8760 hours. HbA1C: No results for input(s): HGBA1C in the last 72 hours. CBG:  Recent  Labs Lab 01/16/16 1129 01/16/16 1728 01/16/16 2127 01/17/16 0730 01/17/16 1118  GLUCAP 159* 127* 102* 101* 93   Lipid Profile: No results for input(s): CHOL, HDL, LDLCALC, TRIG, CHOLHDL, LDLDIRECT in the last 72 hours. Thyroid Function Tests: No results for input(s): TSH, T4TOTAL, FREET4, T3FREE, THYROIDAB in the last 72 hours. Anemia Panel: No results for input(s): VITAMINB12, FOLATE, FERRITIN, TIBC, IRON, RETICCTPCT in the last 72 hours. Urine analysis:    Component Value Date/Time   COLORURINE YELLOW 01/10/2016 2353   APPEARANCEUR CLEAR 01/10/2016 2353   LABSPEC 1.026 01/10/2016 2353   PHURINE 5.5 01/10/2016 2353   GLUCOSEU >1000* 01/10/2016 2353   HGBUR NEGATIVE 01/10/2016 2353   BILIRUBINUR NEGATIVE 01/10/2016 2353   KETONESUR NEGATIVE 01/10/2016 2353   PROTEINUR NEGATIVE 01/10/2016 2353   NITRITE NEGATIVE 01/10/2016 2353   LEUKOCYTESUR NEGATIVE 01/10/2016 2353   Sepsis Labs: @LABRCNTIP (procalcitonin:4,lacticidven:4)  ) Recent Results (from the past 240 hour(s))  Culture, blood (routine x 2)     Status: Abnormal   Collection Time: 01/10/16 11:20 PM  Result Value Ref Range Status   Specimen Description BLOOD LEFT ARM  Final   Special Requests BOTTLES DRAWN AEROBIC AND ANAEROBIC  Final   Culture  Setup Time   Final    GRAM NEGATIVE RODS IN BOTH AEROBIC AND ANAEROBIC BOTTLES CRITICAL RESULT CALLED TO, READ BACK BY AND VERIFIED WITH: Despina Pole AT 1509 01/11/16 BY L BENFIELD    Culture ESCHERICHIA COLI (A)  Final   Report Status 01/13/2016 FINAL  Final   Organism ID, Bacteria ESCHERICHIA COLI  Final      Susceptibility   Escherichia coli - MIC*    AMPICILLIN >=32 RESISTANT Resistant     CEFAZOLIN 8 SENSITIVE Sensitive     CEFEPIME <=1 SENSITIVE Sensitive     CEFTAZIDIME <=1 SENSITIVE Sensitive     CEFTRIAXONE <=1 SENSITIVE Sensitive     CIPROFLOXACIN <=0.25 SENSITIVE Sensitive     GENTAMICIN <=1 SENSITIVE Sensitive     IMIPENEM <=0.25 SENSITIVE  Sensitive     TRIMETH/SULFA <=20 SENSITIVE Sensitive     AMPICILLIN/SULBACTAM 16 INTERMEDIATE Intermediate     PIP/TAZO <=4 SENSITIVE Sensitive     * ESCHERICHIA COLI  Blood Culture ID Panel (Reflexed)     Status: Abnormal   Collection Time: 01/10/16  11:20 PM  Result Value Ref Range Status   Enterococcus species NOT DETECTED NOT DETECTED Final   Vancomycin resistance NOT DETECTED NOT DETECTED Final   Listeria monocytogenes NOT DETECTED NOT DETECTED Final   Staphylococcus species NOT DETECTED NOT DETECTED Final   Staphylococcus aureus NOT DETECTED NOT DETECTED Final   Methicillin resistance NOT DETECTED NOT DETECTED Final   Streptococcus species NOT DETECTED NOT DETECTED Final   Streptococcus agalactiae NOT DETECTED NOT DETECTED Final   Streptococcus pneumoniae NOT DETECTED NOT DETECTED Final   Streptococcus pyogenes NOT DETECTED NOT DETECTED Final   Acinetobacter baumannii NOT DETECTED NOT DETECTED Final   Enterobacteriaceae species DETECTED (A) NOT DETECTED Final    Comment: CRITICAL RESULT CALLED TO, READ BACK BY AND VERIFIED WITH: M MANCHERIL,PHARMD AT 1509 01/11/16 BY L BENFIELD    Enterobacter cloacae complex NOT DETECTED NOT DETECTED Final   Escherichia coli DETECTED (A) NOT DETECTED Final    Comment: CRITICAL RESULT CALLED TO, READ BACK BY AND VERIFIED WITH: M MANCHERIL,PHARMD AT 1509 01/11/16 BY L BENFIELD    Klebsiella oxytoca NOT DETECTED NOT DETECTED Final   Klebsiella pneumoniae NOT DETECTED NOT DETECTED Final   Proteus species NOT DETECTED NOT DETECTED Final   Serratia marcescens NOT DETECTED NOT DETECTED Final   Carbapenem resistance NOT DETECTED NOT DETECTED Final   Haemophilus influenzae NOT DETECTED NOT DETECTED Final   Neisseria meningitidis NOT DETECTED NOT DETECTED Final   Pseudomonas aeruginosa NOT DETECTED NOT DETECTED Final   Candida albicans NOT DETECTED NOT DETECTED Final   Candida glabrata NOT DETECTED NOT DETECTED Final   Candida krusei NOT DETECTED NOT  DETECTED Final   Candida parapsilosis NOT DETECTED NOT DETECTED Final   Candida tropicalis NOT DETECTED NOT DETECTED Final  Culture, blood (routine x 2)     Status: Abnormal   Collection Time: 01/10/16 11:30 PM  Result Value Ref Range Status   Specimen Description BLOOD RIGHT ARM  Final   Special Requests BOTTLES DRAWN AEROBIC AND ANAEROBIC  Final   Culture  Setup Time   Final    GRAM NEGATIVE RODS IN BOTH AEROBIC AND ANAEROBIC BOTTLES CRITICAL RESULT CALLED TO, READ BACK BY AND VERIFIED WITH: M MANCHERIL,PHARMD AT 1509 01/11/16 BY L BENFIELD    Culture (A)  Final    ESCHERICHIA COLI SUSCEPTIBILITIES PERFORMED ON PREVIOUS CULTURE WITHIN THE LAST 5 DAYS.    Report Status 01/13/2016 FINAL  Final  Surgical pcr screen     Status: None   Collection Time: 01/11/16  4:25 PM  Result Value Ref Range Status   MRSA, PCR NEGATIVE NEGATIVE Final   Staphylococcus aureus NEGATIVE NEGATIVE Final    Comment:        The Xpert SA Assay (FDA approved for NASAL specimens in patients over 80 years of age), is one component of a comprehensive surveillance program.  Test performance has been validated by [facility] for patients greater than or equal to 19 year old. It is not intended to diagnose infection nor to guide or monitor treatment.   Culture, blood (routine x 2)     Status: None (Preliminary result)   Collection Time: 01/13/16  9:22 AM  Result Value Ref Range Status   Specimen Description BLOOD LEFT ARM  Final   Special Requests BOTTLES DRAWN AEROBIC AND ANAEROBIC 10CC  Final   Culture NO GROWTH 3 DAYS  Final   Report Status PENDING  Incomplete  Culture, blood (routine x 2)     Status: None (Preliminary result)  Collection Time: 01/13/16  9:33 AM  Result Value Ref Range Status   Specimen Description BLOOD LEFT HAND  Final   Special Requests IN PEDIATRIC BOTTLE 1CC  Final   Culture NO GROWTH 3 DAYS  Final   Report Status PENDING  Incomplete      Radiology Studies: No results  found.   Scheduled Meds: . amLODipine  5 mg Oral Daily  . aspirin EC  81 mg Oral Daily  . cefTRIAXone (ROCEPHIN)  IV  2 g Intravenous Q24H  . heparin  5,000 Units Subcutaneous Q8H  . insulin aspart  0-9 Units Subcutaneous TID WC  . metoprolol succinate  25 mg Oral Daily  . pravastatin  20 mg Oral q1800   Continuous Infusions: . sodium chloride 75 mL/hr at 01/16/16 1824     LOS: 7 days   Time Spent in minutes   30 minutes  Tanvi Gatling D.O. on 01/17/2016 at 11:42 AM  Between 7am to 7pm - Pager - [phone]  After 7pm go to [email or web address] - password [email or web address]  And look for the night coverage person covering for me after hours  [facility] Office  [phone]

## 2016-01-17 NOTE — Progress Notes (Signed)
5 Days Post-Op  Subjective: She feels fine, a bit sore, but no real complaints.  Drain shows what looks serous with old hematoma breakdown.  Tolerating diet well, + BM yesterday.  Does complain of nasal sinus drainage.    Objective: Vital signs in last 24 hours: Temp:  [98.1 F (36.7 C)-98.9 F (37.2 C)] 98.1 F (36.7 C) (06/01 0451) Pulse Rate:  [90-97] 97 (06/01 0451) Resp:  [16-17] 16 (06/01 0451) BP: (125-146)/(66-81) 146/81 mmHg (06/01 0451) SpO2:  [90 %-97 %] 97 % (06/01 0451) Last BM Date: 01/16/16 PO 960 2850 urine Drain 25 Afebrile, VSS Creatinine up but stable WBC still 27K IOC: Negative for CBD stones Intake/Output from previous day: 05/31 0701 - 06/01 0700 In: 840 [P.O.:840] Out: 2875 [Urine:2850; Drains:25] Intake/Output this shift:    General appearance: alert, cooperative and no distress Resp: clear to auscultation bilaterally GI: soft, sore, sites look fine, drainage as noted above, not very much.  she is not tender except around the RUQ, and this appears consistent with post op pain.  Lab Results:   Recent Labs  01/16/16 0456 01/17/16 0245  WBC 27.1* 27.4*  HGB 11.8* 11.3*  HCT 36.4 34.3*  PLT 323 307    BMET  Recent Labs  01/16/16 0456 01/17/16 0245  NA 138 137  K 3.4* 3.1*  CL 105 106  CO2 25 24  GLUCOSE 102* 102*  BUN 14 12  CREATININE 1.27* 1.22*  CALCIUM 8.2* 8.0*   PT/INR No results for input(s): LABPROT, INR in the last 72 hours.   Recent Labs Lab 01/10/16 2219 01/11/16 1119  AST 33 56*  ALT 27 42  ALKPHOS 86 103  BILITOT 0.7 0.8  PROT 6.8 6.8  ALBUMIN 2.6* 2.5*     Lipase     Component Value Date/Time   LIPASE 20 01/10/2016 2219     Studies/Results: No results found. Prior to Admission medications   Medication Sig Start Date End Date Taking? Authorizing Provider  allopurinol (ZYLOPRIM) 300 MG tablet Take 300 mg by mouth daily.   Yes Historical Provider, MD  amLODipine (NORVASC) 5 MG tablet Take 5 mg by  mouth daily.   Yes Historical Provider, MD  canagliflozin (INVOKANA) 100 MG TABS tablet Take 100 mg by mouth daily before breakfast.   Yes Historical Provider, MD  cholecalciferol (VITAMIN D) 1000 units tablet Take 1,000 Units by mouth daily.   Yes Historical Provider, MD  losartan (COZAAR) 100 MG tablet Take 100 mg by mouth daily.   Yes Historical Provider, MD  metoprolol succinate (TOPROL-XL) 25 MG 24 hr tablet Take 25 mg by mouth daily.   Yes Historical Provider, MD  ondansetron (ZOFRAN) 8 MG tablet Take 1 tablet (8 mg total) by mouth every 4 (four) hours as needed. 01/06/16  Yes Donnetta Hutching, MD  oxyCODONE-acetaminophen (PERCOCET) 5-325 MG tablet Take 1-2 tablets by mouth every 4 (four) hours as needed. 01/06/16  Yes Donnetta Hutching, MD  pravastatin (PRAVACHOL) 20 MG tablet Take 20 mg by mouth daily.   Yes Historical Provider, MD  FIBER SELECT GUMMIES PO Take 1 tablet by mouth daily as needed (Constipation).    Historical Provider, MD    Medications: . amLODipine  5 mg Oral Daily  . aspirin EC  81 mg Oral Daily  . cefTRIAXone (ROCEPHIN)  IV  2 g Intravenous Q24H  . heparin  5,000 Units Subcutaneous Q8H  . insulin aspart  0-9 Units Subcutaneous TID WC  . metoprolol succinate  25 mg Oral  Daily  . pravastatin  20 mg Oral q1800   . sodium chloride 75 mL/hr at 01/16/16 1824    Assessment/Plan Gangrenous cholecystitis with abscess S/p laparoscopic cholecystectomy with IOC, 01/12/16, Dr. Luisa Hartornett  E coli bacteremia Leukocytosis  Mild troponin elevation Hypertension Dyslipidemia  AODM Chronic low back pain FEN:  [place] Modified ID: 4 days of Ceftriaxone completed, day 3 ertapenem, now back on Rocephin day 1 DVT:  Heparin/SCD  Plan:  We are concerned with post op leukocytosis and Dr. Andrey CampanileWilson would like to wait till tomorrow for CT scan. Continue antibiotics for now.  Appreciate Dr. Feliz BeamSnider's assistance.   LOS: 7 days    Betsabe Iglesia 01/17/2016 [phone]

## 2016-01-17 NOTE — Progress Notes (Signed)
[facility]   Reason for visit: Follow up on E coli bacteremia  Interval History: WBC about the same, back on ceftriaxone. No increase in pain.  Somewhat distended due to drinking contrast for CT today.  Otherwise feels fine.   Physical Exam: Constitutional:  Filed Vitals:   01/17/16 0451 01/17/16 1319  BP: 146/81 148/72  Pulse: 97 92  Temp: 98.1 F (36.7 C) 99.4 F (37.4 C)  Resp: 16 16   patient appears in NAD Respiratory: Normal respiratory effort; CTA B Cardiovascular: RRR GI: distended, soft, nt  Review of Systems: Constitutional: negative for fevers and chills Gastrointestinal: negative for diarrhea  Lab Results  Component Value Date   WBC 27.4* 01/17/2016   HGB 11.3* 01/17/2016   HCT 34.3* 01/17/2016   MCV 93.2 01/17/2016   PLT 307 01/17/2016    Lab Results  Component Value Date   CREATININE 1.22* 01/17/2016   BUN 12 01/17/2016   NA 137 01/17/2016   K 3.1* 01/17/2016   CL 106 01/17/2016   CO2 24 01/17/2016    Lab Results  Component Value Date   ALT 42 01/11/2016   AST 56* 01/11/2016   ALKPHOS 103 01/11/2016     Microbiology: Recent Results (from the past 240 hour(s))  Culture, blood (routine x 2)     Status: Abnormal   Collection Time: 01/10/16 11:20 PM  Result Value Ref Range Status   Specimen Description BLOOD LEFT ARM  Final   Special Requests BOTTLES DRAWN AEROBIC AND ANAEROBIC 5ML  Final   Culture  Setup Time   Final    GRAM NEGATIVE RODS IN BOTH AEROBIC AND ANAEROBIC BOTTLES CRITICAL RESULT CALLED TO, READ BACK BY AND VERIFIED WITH: Despina PoleM MANCHERIL,PHARMD AT 1509 01/11/16 BY L BENFIELD    Culture ESCHERICHIA COLI (A)  Final   Report Status 01/13/2016 FINAL  Final   Organism ID, Bacteria ESCHERICHIA COLI  Final      Susceptibility   Escherichia coli - MIC*    AMPICILLIN >=32 RESISTANT Resistant     CEFAZOLIN 8 SENSITIVE Sensitive     CEFEPIME <=1 SENSITIVE Sensitive     CEFTAZIDIME <=1 SENSITIVE Sensitive    CEFTRIAXONE <=1 SENSITIVE Sensitive     CIPROFLOXACIN <=0.25 SENSITIVE Sensitive     GENTAMICIN <=1 SENSITIVE Sensitive     IMIPENEM <=0.25 SENSITIVE Sensitive     TRIMETH/SULFA <=20 SENSITIVE Sensitive     AMPICILLIN/SULBACTAM 16 INTERMEDIATE Intermediate     PIP/TAZO <=4 SENSITIVE Sensitive     * ESCHERICHIA COLI  Blood Culture ID Panel (Reflexed)     Status: Abnormal   Collection Time: 01/10/16 11:20 PM  Result Value Ref Range Status   Enterococcus species NOT DETECTED NOT DETECTED Final   Vancomycin resistance NOT DETECTED NOT DETECTED Final   Listeria monocytogenes NOT DETECTED NOT DETECTED Final   Staphylococcus species NOT DETECTED NOT DETECTED Final   Staphylococcus aureus NOT DETECTED NOT DETECTED Final   Methicillin resistance NOT DETECTED NOT DETECTED Final   Streptococcus species NOT DETECTED NOT DETECTED Final   Streptococcus agalactiae NOT DETECTED NOT DETECTED Final   Streptococcus pneumoniae NOT DETECTED NOT DETECTED Final   Streptococcus pyogenes NOT DETECTED NOT DETECTED Final   Acinetobacter baumannii NOT DETECTED NOT DETECTED Final   Enterobacteriaceae species DETECTED (A) NOT DETECTED Final    Comment: CRITICAL RESULT CALLED TO, READ BACK BY AND VERIFIED WITH: Judie PetitM MANCHERIL,PHARMD AT 1509 01/11/16 BY L BENFIELD    Enterobacter cloacae complex NOT DETECTED  NOT DETECTED Final   Escherichia coli DETECTED (A) NOT DETECTED Final    Comment: CRITICAL RESULT CALLED TO, READ BACK BY AND VERIFIED WITH: Despina Pole AT 1509 01/11/16 BY L BENFIELD    Klebsiella oxytoca NOT DETECTED NOT DETECTED Final   Klebsiella pneumoniae NOT DETECTED NOT DETECTED Final   Proteus species NOT DETECTED NOT DETECTED Final   Serratia marcescens NOT DETECTED NOT DETECTED Final   Carbapenem resistance NOT DETECTED NOT DETECTED Final   Haemophilus influenzae NOT DETECTED NOT DETECTED Final   Neisseria meningitidis NOT DETECTED NOT DETECTED Final   Pseudomonas aeruginosa NOT DETECTED NOT  DETECTED Final   Candida albicans NOT DETECTED NOT DETECTED Final   Candida glabrata NOT DETECTED NOT DETECTED Final   Candida krusei NOT DETECTED NOT DETECTED Final   Candida parapsilosis NOT DETECTED NOT DETECTED Final   Candida tropicalis NOT DETECTED NOT DETECTED Final  Culture, blood (routine x 2)     Status: Abnormal   Collection Time: 01/10/16 11:30 PM  Result Value Ref Range Status   Specimen Description BLOOD RIGHT ARM  Final   Special Requests BOTTLES DRAWN AEROBIC AND ANAEROBIC  Final   Culture  Setup Time   Final    GRAM NEGATIVE RODS IN BOTH AEROBIC AND ANAEROBIC BOTTLES CRITICAL RESULT CALLED TO, READ BACK BY AND VERIFIED WITH: M MANCHERIL,PHARMD AT 1509 01/11/16 BY L BENFIELD    Culture (A)  Final    ESCHERICHIA COLI SUSCEPTIBILITIES PERFORMED ON PREVIOUS CULTURE WITHIN THE LAST 5 DAYS.    Report Status 01/13/2016 FINAL  Final  Surgical pcr screen     Status: None   Collection Time: 01/11/16  4:25 PM  Result Value Ref Range Status   MRSA, PCR NEGATIVE NEGATIVE Final   Staphylococcus aureus NEGATIVE NEGATIVE Final    Comment:        The Xpert SA Assay (FDA approved for NASAL specimens in patients over 54 years of age), is one component of a comprehensive surveillance program.  Test performance has been validated by [facility] for patients greater than or equal to 38 year old. It is not intended to diagnose infection nor to guide or monitor treatment.   Culture, blood (routine x 2)     Status: None (Preliminary result)   Collection Time: 01/13/16  9:22 AM  Result Value Ref Range Status   Specimen Description BLOOD LEFT ARM  Final   Special Requests BOTTLES DRAWN AEROBIC AND ANAEROBIC 10CC  Final   Culture NO GROWTH 3 DAYS  Final   Report Status PENDING  Incomplete  Culture, blood (routine x 2)     Status: None (Preliminary result)   Collection Time: 01/13/16  9:33 AM  Result Value Ref Range Status   Specimen Description BLOOD LEFT HAND  Final    Special Requests IN PEDIATRIC BOTTLE 1CC  Final   Culture NO GROWTH 3 DAYS  Final   Report Status PENDING  Incomplete    Impression/Plan:  1. E coli bacteremia - repeat blood cultures ngtd.  On ceftriaxone after 3 days of ertapenem.  Will continue. 2. Leukocytosis - persistently elevated but no new signs of infection.  CT today.

## 2016-01-18 LAB — DIFFERENTIAL
BASOS PCT: 0 %
Basophils Absolute: 0 10*3/uL (ref 0.0–0.1)
EOS PCT: 0 %
Eosinophils Absolute: 0 10*3/uL (ref 0.0–0.7)
LYMPHS ABS: 2.6 10*3/uL (ref 0.7–4.0)
Lymphocytes Relative: 11 %
MONO ABS: 2.1 10*3/uL — AB (ref 0.1–1.0)
Monocytes Relative: 9 %
Neutro Abs: 18.7 10*3/uL — ABNORMAL HIGH (ref 1.7–7.7)
Neutrophils Relative %: 80 %

## 2016-01-18 LAB — CULTURE, BLOOD (ROUTINE X 2)
CULTURE: NO GROWTH
CULTURE: NO GROWTH

## 2016-01-18 LAB — CBC
HCT: 34.4 % — ABNORMAL LOW (ref 36.0–46.0)
Hemoglobin: 11.1 g/dL — ABNORMAL LOW (ref 12.0–15.0)
MCH: 29.6 pg (ref 26.0–34.0)
MCHC: 32.3 g/dL (ref 30.0–36.0)
MCV: 91.7 fL (ref 78.0–100.0)
PLATELETS: 379 10*3/uL (ref 150–400)
RBC: 3.75 MIL/uL — ABNORMAL LOW (ref 3.87–5.11)
RDW: 13.7 % (ref 11.5–15.5)
WBC: 23.4 10*3/uL — ABNORMAL HIGH (ref 4.0–10.5)

## 2016-01-18 LAB — GLUCOSE, CAPILLARY
GLUCOSE-CAPILLARY: 154 mg/dL — AB (ref 65–99)
Glucose-Capillary: 88 mg/dL (ref 65–99)
Glucose-Capillary: 105 mg/dL — ABNORMAL HIGH (ref 65–99)
Glucose-Capillary: 174 mg/dL — ABNORMAL HIGH (ref 65–99)

## 2016-01-18 LAB — BASIC METABOLIC PANEL
Anion gap: 10 (ref 5–15)
BUN: 15 mg/dL (ref 6–20)
CALCIUM: 8.4 mg/dL — AB (ref 8.9–10.3)
CO2: 24 mmol/L (ref 22–32)
Chloride: 106 mmol/L (ref 101–111)
Creatinine, Ser: 1.25 mg/dL — ABNORMAL HIGH (ref 0.44–1.00)
GFR calc Af Amer: 49 mL/min — ABNORMAL LOW (ref >60)
GFR, EST NON AFRICAN AMERICAN: 43 mL/min — AB (ref >60)
GLUCOSE: 98 mg/dL (ref 65–99)
Potassium: 3.2 mmol/L — ABNORMAL LOW (ref 3.5–5.1)
Sodium: 140 mmol/L (ref 135–145)

## 2016-01-18 LAB — MAGNESIUM: MAGNESIUM: 1.9 mg/dL (ref 1.7–2.4)

## 2016-01-18 MED ORDER — FUROSEMIDE 10 MG/ML IJ SOLN
20.0000 mg | Freq: Once | INTRAMUSCULAR | Status: AC
  Administered 2016-01-18: 20 mg via INTRAVENOUS
  Filled 2016-01-18: qty 2

## 2016-01-18 MED ORDER — POTASSIUM CHLORIDE CRYS ER 20 MEQ PO TBCR
40.0000 meq | EXTENDED_RELEASE_TABLET | Freq: Once | ORAL | Status: AC
  Administered 2016-01-18: 40 meq via ORAL
  Filled 2016-01-18: qty 2

## 2016-01-18 NOTE — Care Management Important Message (Signed)
Important Message  Patient Details  Name: Angel Gonzalez MRN: [id number] Date of Birth: [birth year]   Medicare Important Message Given:  Yes    Bernadette HoitShoffner, Aithana Kushner Coleman 01/18/2016, 11:09 AM

## 2016-01-18 NOTE — Progress Notes (Signed)
6 Days Post-Op  Subjective: 1 episode of emesis yesterday while drinking contrast. None since then. Tolerated rest of meals. No productive cough. +flatus  Objective: Vital signs in last 24 hours: Temp:  [98.2 F (36.8 C)-99.4 F (37.4 C)] 98.6 F (37 C) (06/02 0522) Pulse Rate:  [88-94] 88 (06/02 0522) Resp:  [15-18] 18 (06/02 0522) BP: (134-148)/(64-72) 134/70 mmHg (06/02 0522) SpO2:  [93 %-98 %] 93 % (06/02 0522) Last BM Date: 01/17/16  Intake/Output from previous day: 06/01 0701 - 06/02 0700 In: 1921 [P.O.:1320; I.V.:601] Out: 2075 [Urine:1700; Emesis/NG output:350; Drains:25] Intake/Output this shift:    Resting comfortably Soft, obese, nt, drain -serosang. Incisions c/d/i  Lab Results:   Recent Labs  01/17/16 0245 01/18/16 0550  WBC 27.4* 23.4*  HGB 11.3* 11.1*  HCT 34.3* 34.4*  PLT 307 379   BMET  Recent Labs  01/17/16 0245 01/18/16 0550  NA 137 140  K 3.1* 3.2*  CL 106 106  CO2 24 24  GLUCOSE 102* 98  BUN 12 15  CREATININE 1.22* 1.25*  CALCIUM 8.0* 8.4*   PT/INR No results for input(s): LABPROT, INR in the last 72 hours. ABG No results for input(s): PHART, HCO3 in the last 72 hours.  Invalid input(s): PCO2, PO2  Studies/Results: Ct Abdomen Pelvis W Contrast  01/17/2016  CLINICAL DATA:  Status post cholecystectomy 4 days ago for cholecystitis. Drainage of intra-abdominal abscess. Rising white blood cell count. EXAM: CT ABDOMEN AND PELVIS WITH CONTRAST TECHNIQUE: Multidetector CT imaging of the abdomen and pelvis was performed using the standard protocol following bolus administration of intravenous contrast. CONTRAST:  ISOVUE-300 IOPAMIDOL (ISOVUE-300) INJECTION 61% COMPARISON:  01/06/2016 FINDINGS: Lung bases: Large right pleural effusion with atelectasis of the right lower lobe and partial right middle lobe atelectasis. Small left pleural effusion. Heart normal in size. Hepatobiliary: In the gallbladder fossa, there is low attenuation material  mixed with bubbles of air measuring 3.6 x 2.3 cm transversely. A drainage catheter lies along the posterior/inferior margin of the liver. There is some ill-defined low attenuation within the liver, anterior segment of the right lobe, extending from the gallbladder fossa, but no evidence of a defined liver abscess. Remainder of the liver is unremarkable. Common bile duct measures 9 mm in its midportion. It tapers through the pancreatic head. No CT evidence of a duct stone. Spleen, pancreas, adrenal glands:  Unremarkable. Kidneys, ureters, bladder: 13 mm right midpole cyst. No other renal masses. No hydronephrosis. Ureters are normal course and caliber. Bladder is unremarkable. Uterus and adnexa: There are uterine calcifications likely within small fibroids. Possible submucosal fibroid versus polyp arising from the right uterine fundus. No adnexal masses. Lymph nodes:  No adenopathy. Ascites:  Trace ascites noted in the right upper quadrant. Gastrointestinal: Normal stomach and small bowel. Numerous left colon diverticula. No diverticulitis. Colon otherwise unremarkable. Normal appendix visualized. Musculoskeletal: Degenerative changes noted in the lower lumbar spine. No osteoblastic or osteolytic lesions. IMPRESSION: 1. Small amount of low attenuation material mixed with bubbles of air noted in the gallbladder fossa, but no defined abscess. Trace amount of ascites noted in the right upper quadrant. 2. Large right pleural effusion with associated right lower lobe atelectasis and partial right middle lobe atelectasis. Small left pleural effusion. Electronically Signed   By: Amie Portland M.D.   On: 01/17/2016 17:40    Anti-infectives: Anti-infectives    Start     Dose/Rate Route Frequency Ordered Stop   01/16/16 1800  cefTRIAXone (ROCEPHIN) 2 g in dextrose 5 %  50 mL IVPB     2 g 100 mL/hr over 30 Minutes Intravenous Every 24 hours 01/16/16 1724     01/14/16 0930  ertapenem (INVANZ) 1 g in sodium chloride 0.9 %  50 mL IVPB  Status:  Discontinued     1 g 100 mL/hr over 30 Minutes Intravenous Every 24 hours 01/14/16 0842 01/16/16 1723   01/10/16 2330  cefTRIAXone (ROCEPHIN) 2 g in dextrose 5 % 50 mL IVPB  Status:  Discontinued     2 g 100 mL/hr over 30 Minutes Intravenous Every 24 hours 01/10/16 2310 01/14/16 0842      Assessment/Plan: s/p Procedure(s): LAPAROSCOPIC CHOLECYSTECTOMY WITH INTRAOPERATIVE CHOLANGIOGRAM (N/A) Gangrenous cholecystitis with abscess S/p laparoscopic cholecystectomy with IOC, 01/12/16, Dr. Luisa Hartornett  E coli bacteremia Leukocytosis  Mild troponin elevation Hypertension Dyslipidemia  AODM Chronic low back pain FEN: Carb Modified ID: 4 days of Ceftriaxone completed, day 3 ertapenem, now back on Rocephin day 2 DVT: Heparin/SCD  CT a/p shows no significant abd issue. Small fluid collection which is expected and moreover drain is going thru it.  Will defer to medicine and ID about Rt pleural effusion.  Will defer to ID about duration/modality of abx Pt will go home with drain ((could technically be removed since output low volume and just serosang) Nothing really to add at this point  Mary SellaEric M. Andrey CampanileWilson, MD, FACS General, Bariatric, & Minimally Invasive Surgery [facility], [place]    LOS: 8 days    Atilano InaWILSON,Lynde Ludwig M 01/18/2016

## 2016-01-18 NOTE — Progress Notes (Signed)
PROGRESS NOTE    Angel Gonzalez  BJY:[id number] DOB: 22-Jun-1946 DOA: 01/10/2016 PCP: [facility]   Brief Narrative:  HPI On 01/10/2016 by Dr. Odie Sera Angel Gonzalez is a 70 y.o. female with medical history significant for hypertension, hyperlipidemia, type 2 diabetes mellitus, and chronic low back pain who presents as a direct admission, sent by Judee Clara, NP for evaluation of biliary colic. Patient reports being in her usual state of health until 01/06/2016 when there was insidious development of a dull ache in the right upper quadrant. This pain progressed over the course of approximately 30 minutes and became accompanied by nausea and vomiting before spontaneously resolving. Since that time, she's had recurrences in this pain with increasing frequency.She now reports a constant, dull ache at the right upper quadrant that is tolerable while at rest, but worse while moving around. She denies noticing any change in her symptoms with oral intake but notes that she has not been up to hold much down due to the nausea and vomiting. She was evaluated for these complaints in the [facility] emergency department on 01/06/2016 with CT scan that demonstrated a 12 mm stone at the gallbladder neck, but per the provider notes, there was no evidence of acute cholecystitis and she was discharged home to follow-up with her primary care provider for abdominal ultrasound in her home town of [place]. Patient reports seeing Judee Clara, [name], being told she would need cholecystectomy, and was advised to present to registration at [facility] for direct admission.  Patient is evaluated on the surgical unit at [facility] where she is found to be afebrile, saturating well on room air, and with vital signs stable. She denies any pain currently, noting that she had some earlier this morning. She is in no apparent distress, nontoxic in appearance. She denies any pain  currently, denies any recent chest pain, palpitations, dyspnea, or cough. There is been non-bloody vomiting, but no diarrhea. She denies any urinary symptoms and denies recent antibiotic use. She'll be placed in observation status for further evaluation and management of suspected biliary colic.  Assessment & Plan   Biliary colic / Acute cholecystitis / leukocytosis -Abdominal ultrasound significant for acute cholecystitis -Normal liver function enzymes, normal lipase -S/P lap cholecystectomy 5/27 and drain placement -Worsening leukocytosis so surgery changed abx from rocephin to Invanz 01/14/2016 -ID recommended narrowing antibiotics, and changing to rocephin on 01/16/2016 -WBC 27.4 today -Diet as tolerated  -General surgery consulted and appreciated -CT Abd/pelvis 01/17/2016: 5 amount of low-attenuation material mixed bubbles of 8 or gallbladder fossa, no defined abscess. Trace amount of ascites right upper quadrant. Large right pleural effusion and associated right lower lobe atelectasis.  E.Coli and Enterobacteriaceaebacteremia -Blood cultures on admission growing E.Coli and Enterobacteriaceae species -Repeat blood cultures showed no growth so far -Antibiotics were changed to Invanz due to worsening leukocytosis  -leukocytosis improving  -Now on ceftriaxone   Mild troponin elevation  -Likely secondary to demand ischemia in the setting of acute cholecystitis -Troponin 0.11, 0.03, 0.04 -No acute ischemic changes on admission 12-lead EKG -Echocardiogram EF 55-60%, grade 1 diastolic dysfunction -Cardiology consulted and appreciated. Recommended holding ACEI until renal function improves, outpatient follow up in 1-3 months, Aspirin 81mg   Right pleural effusion -Noted on CT abd/pelvis -Will give dose of lasix -IVF discontinued 6/1 -No complaints of SOB  Essential hypertension -Continue metoprolol 25 mg daily and Norvasc 5 mg daily   Hyperlipidemia -Continue Pravachol  Non-insulin  dependent  type 2 diabetes mellitus without long-term insulin use (HCC) -Hemoglobin A1c is 6.4 -Continue insulin sliding scale and CBG monitoring   Hypokalemia/Hypomagnesemia -Likely due to GI losses -Continue to replace -Magnesium 1.9 today, continue to replace -Continue to monitor BMP and Magnesium   Acute kidney injury -Creatinine 2.3, improving with hydration  -Cr on 5/21 was 1.4 but no other values for comparison  -Cr improving with hydration -Cr 1.25 today  DVT Prophylaxis  Heparin  Code Status: Full  Family Communication:  Daughter at bedside  Disposition Plan: Admitted. Pending recommendations from ID. Likely d/c 6/3.  Consultants General surgery Cardiology Infectious disease  Procedures  Echocardiogram Lap Cholecystectomy   Antibiotics   Anti-infectives    Start     Dose/Rate Route Frequency Ordered Stop   01/16/16 1800  cefTRIAXone (ROCEPHIN) 2 g in dextrose 5 % 50 mL IVPB     2 g 100 mL/hr over 30 Minutes Intravenous Every 24 hours 01/16/16 1724     01/14/16 0930  ertapenem (INVANZ) 1 g in sodium chloride 0.9 % 50 mL IVPB  Status:  Discontinued     1 g 100 mL/hr over 30 Minutes Intravenous Every 24 hours 01/14/16 0842 01/16/16 1723   01/10/16 2330  cefTRIAXone (ROCEPHIN) 2 g in dextrose 5 % 50 mL IVPB  Status:  Discontinued     2 g 100 mL/hr over 30 Minutes Intravenous Every 24 hours 01/10/16 2310 01/14/16 0842      Subjective:   Angel Gonzalez seen and examined today.  Patient has no complaints today. Had some nausea and vomiting after contrast yesterday.  Denies any chest pain, shortness breath, nausea vomiting, abdominal pain at this time.  Objective:   Filed Vitals:   01/17/16 0451 01/17/16 1319 01/17/16 2148 01/18/16 0522  BP: 146/81 148/72 145/64 134/70  Pulse: 97 92 94 88  Temp: 98.1 F (36.7 C) 99.4 F (37.4 C) 98.2 F (36.8 C) 98.6 F (37 C)  TempSrc: Oral Oral Oral Oral  Resp: Height:      Weight:      SpO2: 97% 98%  93% 93%    Intake/Output Summary (Last 24 hours) at 01/18/16 1208 Last data filed at 01/18/16 1019  Gross per 24 hour  Intake   2041 ml  Output   2525 ml  Net   -484 ml   Filed Weights   01/10/16 1905  Weight: 74.39 kg (164 lb)    Exam  General: Well developed, well nourished, no distress  HEENT: NCAT, mucous membranes moist.   Cardiovascular: S1 S2 auscultated, RRR. No murmurs  Respiratory: Clear to auscultation   Abdomen: Soft, nontender, nondistended, + bowel sounds  Extremities: warm dry without cyanosis clubbing or edema  Neuro: AAOx3, nonfocal  Psych: Normal affect and demeanor, pleasant   Data Reviewed: I have personally reviewed following labs and imaging studies  CBC:  Recent Labs Lab 01/14/16 0427 01/15/16 0501 01/16/16 0456 01/17/16 0245 01/18/16 0550  WBC 24.5* 24.6* 27.1* 27.4* 23.4*  NEUTROABS  --   --   --  22.2* 18.7*  HGB 11.7* 11.4* 11.8* 11.3* 11.1*  HCT 36.8 35.7* 36.4 34.3* 34.4*  MCV 95.6 94.9 92.9 93.2 91.7  PLT 233 263 323 307 379   Basic Metabolic Panel:  Recent Labs Lab 01/14/16 0427 01/15/16 0501 01/16/16 0456 01/17/16 0245 01/17/16 0740 01/18/16 0550  NA 142 140 138 137  --  140  K 3.5 3.3* 3.4* 3.1*  --  3.2*  CL  108 109 105 106  --  106  CO2 26 22 25 24   --  24  GLUCOSE 126* 102* 102* 102*  --  98  BUN 17 15 14 12   --  15  CREATININE 1.46* 1.20* 1.27* 1.22*  --  1.25*  CALCIUM 8.4* 8.3* 8.2* 8.0*  --  8.4*  MG  --   --  1.4*  --  1.7 1.9   GFR: Estimated Creatinine Clearance: 40.5 mL/min (by C-G formula based on Cr of 1.25). Liver Function Tests: No results for input(s): AST, ALT, ALKPHOS, BILITOT, PROT, ALBUMIN in the last 168 hours. No results for input(s): LIPASE, AMYLASE in the last 168 hours. No results for input(s): AMMONIA in the last 168 hours. Coagulation Profile: No results for input(s): INR, PROTIME in the last 168 hours. Cardiac Enzymes: No results for input(s): CKTOTAL, CKMB, CKMBINDEX,  TROPONINI in the last 168 hours. BNP (last 3 results) No results for input(s): PROBNP in the last 8760 hours. HbA1C: No results for input(s): HGBA1C in the last 72 hours. CBG:  Recent Labs Lab 01/17/16 1118 01/17/16 1719 01/17/16 2050 01/18/16 0736 01/18/16 1146  GLUCAP 93 86 109* 88 105*   Lipid Profile: No results for input(s): CHOL, HDL, LDLCALC, TRIG, CHOLHDL, LDLDIRECT in the last 72 hours. Thyroid Function Tests: No results for input(s): TSH, T4TOTAL, FREET4, T3FREE, THYROIDAB in the last 72 hours. Anemia Panel: No results for input(s): VITAMINB12, FOLATE, FERRITIN, TIBC, IRON, RETICCTPCT in the last 72 hours. Urine analysis:    Component Value Date/Time   COLORURINE YELLOW 01/10/2016 2353   APPEARANCEUR CLEAR 01/10/2016 2353   LABSPEC 1.026 01/10/2016 2353   PHURINE 5.5 01/10/2016 2353   GLUCOSEU >1000* 01/10/2016 2353   HGBUR NEGATIVE 01/10/2016 2353   BILIRUBINUR NEGATIVE 01/10/2016 2353   KETONESUR NEGATIVE 01/10/2016 2353   PROTEINUR NEGATIVE 01/10/2016 2353   NITRITE NEGATIVE 01/10/2016 2353   LEUKOCYTESUR NEGATIVE 01/10/2016 2353   Sepsis Labs: @LABRCNTIP (procalcitonin:4,lacticidven:4)  ) Recent Results (from the past 240 hour(s))  Culture, blood (routine x 2)     Status: Abnormal   Collection Time: 01/10/16 11:20 PM  Result Value Ref Range Status   Specimen Description BLOOD LEFT ARM  Final   Special Requests BOTTLES DRAWN AEROBIC AND ANAEROBIC  Final   Culture  Setup Time   Final    GRAM NEGATIVE RODS IN BOTH AEROBIC AND ANAEROBIC BOTTLES CRITICAL RESULT CALLED TO, READ BACK BY AND VERIFIED WITH: Despina Pole AT 1509 01/11/16 BY L BENFIELD    Culture ESCHERICHIA COLI (A)  Final   Report Status 01/13/2016 FINAL  Final   Organism ID, Bacteria ESCHERICHIA COLI  Final      Susceptibility   Escherichia coli - MIC*    AMPICILLIN >=32 RESISTANT Resistant     CEFAZOLIN 8 SENSITIVE Sensitive     CEFEPIME <=1 SENSITIVE Sensitive      CEFTAZIDIME <=1 SENSITIVE Sensitive     CEFTRIAXONE <=1 SENSITIVE Sensitive     CIPROFLOXACIN <=0.25 SENSITIVE Sensitive     GENTAMICIN <=1 SENSITIVE Sensitive     IMIPENEM <=0.25 SENSITIVE Sensitive     TRIMETH/SULFA <=20 SENSITIVE Sensitive     AMPICILLIN/SULBACTAM 16 INTERMEDIATE Intermediate     PIP/TAZO <=4 SENSITIVE Sensitive     * ESCHERICHIA COLI  Blood Culture ID Panel (Reflexed)     Status: Abnormal   Collection Time: 01/10/16 11:20 PM  Result Value Ref Range Status   Enterococcus species NOT DETECTED NOT DETECTED Final   Vancomycin resistance  NOT DETECTED NOT DETECTED Final   Listeria monocytogenes NOT DETECTED NOT DETECTED Final   Staphylococcus species NOT DETECTED NOT DETECTED Final   Staphylococcus aureus NOT DETECTED NOT DETECTED Final   Methicillin resistance NOT DETECTED NOT DETECTED Final   Streptococcus species NOT DETECTED NOT DETECTED Final   Streptococcus agalactiae NOT DETECTED NOT DETECTED Final   Streptococcus pneumoniae NOT DETECTED NOT DETECTED Final   Streptococcus pyogenes NOT DETECTED NOT DETECTED Final   Acinetobacter baumannii NOT DETECTED NOT DETECTED Final   Enterobacteriaceae species DETECTED (A) NOT DETECTED Final    Comment: CRITICAL RESULT CALLED TO, READ BACK BY AND VERIFIED WITH: M MANCHERIL,PHARMD AT 1509 01/11/16 BY L BENFIELD    Enterobacter cloacae complex NOT DETECTED NOT DETECTED Final   Escherichia coli DETECTED (A) NOT DETECTED Final    Comment: CRITICAL RESULT CALLED TO, READ BACK BY AND VERIFIED WITH: M MANCHERIL,PHARMD AT 1509 01/11/16 BY L BENFIELD    Klebsiella oxytoca NOT DETECTED NOT DETECTED Final   Klebsiella pneumoniae NOT DETECTED NOT DETECTED Final   Proteus species NOT DETECTED NOT DETECTED Final   Serratia marcescens NOT DETECTED NOT DETECTED Final   Carbapenem resistance NOT DETECTED NOT DETECTED Final   Haemophilus influenzae NOT DETECTED NOT DETECTED Final   Neisseria meningitidis NOT DETECTED NOT DETECTED Final    Pseudomonas aeruginosa NOT DETECTED NOT DETECTED Final   Candida albicans NOT DETECTED NOT DETECTED Final   Candida glabrata NOT DETECTED NOT DETECTED Final   Candida krusei NOT DETECTED NOT DETECTED Final   Candida parapsilosis NOT DETECTED NOT DETECTED Final   Candida tropicalis NOT DETECTED NOT DETECTED Final  Culture, blood (routine x 2)     Status: Abnormal   Collection Time: 01/10/16 11:30 PM  Result Value Ref Range Status   Specimen Description BLOOD RIGHT ARM  Final   Special Requests BOTTLES DRAWN AEROBIC AND ANAEROBIC 5ML  Final   Culture  Setup Time   Final    GRAM NEGATIVE RODS IN BOTH AEROBIC AND ANAEROBIC BOTTLES CRITICAL RESULT CALLED TO, READ BACK BY AND VERIFIED WITH: M MANCHERIL,PHARMD AT 1509 01/11/16 BY L BENFIELD    Culture (A)  Final    ESCHERICHIA COLI SUSCEPTIBILITIES PERFORMED ON PREVIOUS CULTURE WITHIN THE LAST 5 DAYS.    Report Status 01/13/2016 FINAL  Final  Surgical pcr screen     Status: None   Collection Time: 01/11/16  4:25 PM  Result Value Ref Range Status   MRSA, PCR NEGATIVE NEGATIVE Final   Staphylococcus aureus NEGATIVE NEGATIVE Final    Comment:        The Xpert SA Assay (FDA approved for NASAL specimens in patients over 71 years of age), is one component of a comprehensive surveillance program.  Test performance has been validated by [facility] for patients greater than or equal to 71 year old. It is not intended to diagnose infection nor to guide or monitor treatment.   Culture, blood (routine x 2)     Status: None (Preliminary result)   Collection Time: 01/13/16  9:22 AM  Result Value Ref Range Status   Specimen Description BLOOD LEFT ARM  Final   Special Requests BOTTLES DRAWN AEROBIC AND ANAEROBIC 10CC  Final   Culture NO GROWTH 4 DAYS  Final   Report Status PENDING  Incomplete  Culture, blood (routine x 2)     Status: None (Preliminary result)   Collection Time: 01/13/16  9:33 AM  Result Value Ref Range Status   Specimen  Description BLOOD LEFT  HAND  Final   Special Requests IN PEDIATRIC BOTTLE 1CC  Final   Culture NO GROWTH 4 DAYS  Final   Report Status PENDING  Incomplete      Radiology Studies: Ct Abdomen Pelvis W Contrast  01/17/2016  CLINICAL DATA:  Status post cholecystectomy 4 days ago for cholecystitis. Drainage of intra-abdominal abscess. Rising white blood cell count. EXAM: CT ABDOMEN AND PELVIS WITH CONTRAST TECHNIQUE: Multidetector CT imaging of the abdomen and pelvis was performed using the standard protocol following bolus administration of intravenous contrast. CONTRAST:  ISOVUE-300 IOPAMIDOL (ISOVUE-300) INJECTION 61% COMPARISON:  01/06/2016 FINDINGS: Lung bases: Large right pleural effusion with atelectasis of the right lower lobe and partial right middle lobe atelectasis. Small left pleural effusion. Heart normal in size. Hepatobiliary: In the gallbladder fossa, there is low attenuation material mixed with bubbles of air measuring 3.6 x 2.3 cm transversely. A drainage catheter lies along the posterior/inferior margin of the liver. There is some ill-defined low attenuation within the liver, anterior segment of the right lobe, extending from the gallbladder fossa, but no evidence of a defined liver abscess. Remainder of the liver is unremarkable. Common bile duct measures 9 mm in its midportion. It tapers through the pancreatic head. No CT evidence of a duct stone. Spleen, pancreas, adrenal glands:  Unremarkable. Kidneys, ureters, bladder: 13 mm right midpole cyst. No other renal masses. No hydronephrosis. Ureters are normal course and caliber. Bladder is unremarkable. Uterus and adnexa: There are uterine calcifications likely within small fibroids. Possible submucosal fibroid versus polyp arising from the right uterine fundus. No adnexal masses. Lymph nodes:  No adenopathy. Ascites:  Trace ascites noted in the right upper quadrant. Gastrointestinal: Normal stomach and small bowel. Numerous left colon  diverticula. No diverticulitis. Colon otherwise unremarkable. Normal appendix visualized. Musculoskeletal: Degenerative changes noted in the lower lumbar spine. No osteoblastic or osteolytic lesions. IMPRESSION: 1. Small amount of low attenuation material mixed with bubbles of air noted in the gallbladder fossa, but no defined abscess. Trace amount of ascites noted in the right upper quadrant. 2. Large right pleural effusion with associated right lower lobe atelectasis and partial right middle lobe atelectasis. Small left pleural effusion. Electronically Signed   By: Amie Portland M.D.   On: 01/17/2016 17:40     Scheduled Meds: . amLODipine  5 mg Oral Daily  . aspirin EC  81 mg Oral Daily  . cefTRIAXone (ROCEPHIN)  IV  2 g Intravenous Q24H  . heparin  5,000 Units Subcutaneous Q8H  . insulin aspart  0-9 Units Subcutaneous TID WC  . metoprolol succinate  25 mg Oral Daily  . pravastatin  20 mg Oral q1800   Continuous Infusions:     LOS: 8 days   Time Spent in minutes   30 minutes  Harlowe Dowler D.O. on 01/18/2016 at 12:08 PM  Between 7am to 7pm - Pager - [phone]  After 7pm go to [email or web address] - password [email or web address]  And look for the night coverage person covering for me after hours  [facility] Office  [phone]

## 2016-01-18 NOTE — Progress Notes (Signed)
CT ok, can go home with about 7 days of oral antibiotic such as a cephalosporin to cover the E coli.  thanks

## 2016-01-19 ENCOUNTER — Inpatient Hospital Stay (HOSPITAL_COMMUNITY): Payer: Medicare Other

## 2016-01-19 LAB — CBC
HCT: 36.8 % (ref 36.0–46.0)
Hemoglobin: 11.9 g/dL — ABNORMAL LOW (ref 12.0–15.0)
MCH: 30.4 pg (ref 26.0–34.0)
MCHC: 32.3 g/dL (ref 30.0–36.0)
MCV: 94.1 fL (ref 78.0–100.0)
Platelets: 395 K/uL (ref 150–400)
RBC: 3.91 MIL/uL (ref 3.87–5.11)
RDW: 13.7 % (ref 11.5–15.5)
WBC: 18.6 K/uL — ABNORMAL HIGH (ref 4.0–10.5)

## 2016-01-19 LAB — BASIC METABOLIC PANEL
ANION GAP: 11 (ref 5–15)
BUN: 13 mg/dL (ref 6–20)
CHLORIDE: 104 mmol/L (ref 101–111)
CO2: 24 mmol/L (ref 22–32)
CREATININE: 1.21 mg/dL — AB (ref 0.44–1.00)
Calcium: 8.4 mg/dL — ABNORMAL LOW (ref 8.9–10.3)
GFR calc non Af Amer: 44 mL/min — ABNORMAL LOW (ref >60)
GFR, EST AFRICAN AMERICAN: 51 mL/min — AB (ref >60)
Glucose, Bld: 104 mg/dL — ABNORMAL HIGH (ref 65–99)
POTASSIUM: 4 mmol/L (ref 3.5–5.1)
SODIUM: 139 mmol/L (ref 135–145)

## 2016-01-19 LAB — DIFFERENTIAL
BASOS PCT: 0 %
Basophils Absolute: 0.1 10*3/uL (ref 0.0–0.1)
Eosinophils Absolute: 0.1 10*3/uL (ref 0.0–0.7)
Eosinophils Relative: 1 %
LYMPHS ABS: 2.9 10*3/uL (ref 0.7–4.0)
Lymphocytes Relative: 15 %
MONO ABS: 1.9 10*3/uL — AB (ref 0.1–1.0)
Monocytes Relative: 10 %
Neutro Abs: 13.6 10*3/uL — ABNORMAL HIGH (ref 1.7–7.7)
Neutrophils Relative %: 74 %

## 2016-01-19 LAB — GLUCOSE, CAPILLARY: Glucose-Capillary: 112 mg/dL — ABNORMAL HIGH (ref 65–99)

## 2016-01-19 LAB — MAGNESIUM: Magnesium: 1.6 mg/dL — ABNORMAL LOW (ref 1.7–2.4)

## 2016-01-19 MED ORDER — CEFUROXIME AXETIL 500 MG PO TABS: 500.0000 mg | ORAL_TABLET | Freq: Two times a day (BID) | ORAL | Status: AC

## 2016-01-19 MED ORDER — MAGNESIUM OXIDE 400 MG PO TABS: 400.0000 mg | ORAL_TABLET | Freq: Every day | ORAL | Status: AC

## 2016-01-19 MED ORDER — MAGNESIUM SULFATE 2 GM/50ML IV SOLN
2.0000 g | Freq: Once | INTRAVENOUS | Status: AC
Start: 2016-01-19 — End: 2016-01-19
  Administered 2016-01-19: 2 g via INTRAVENOUS
  Filled 2016-01-19: qty 50

## 2016-01-19 MED ORDER — FUROSEMIDE 20 MG PO TABS: 20.0000 mg | ORAL_TABLET | Freq: Every day | ORAL | Status: AC

## 2016-01-19 MED ORDER — ASPIRIN 81 MG PO TBEC: 81.0000 mg | DELAYED_RELEASE_TABLET | Freq: Every day | ORAL | Status: AC

## 2016-01-19 NOTE — Progress Notes (Addendum)
AVS and JP supplies to patient who verbalizes understanding, DC to private car home with all personal belongings accompanied by daughter at  [phone].

## 2016-01-19 NOTE — Progress Notes (Signed)
7 Days Post-Op  Subjective: She is doing well and wants to go home.  Denies pain and does not require any analgesic prescriptions Tolerating diet very well   Objective: Vital signs in last 24 hours: Temp:  [98.6 F (37 C)-100.2 F (37.9 C)] 99.1 F (37.3 C) (06/03 0529) Pulse Rate:  [85-92] 85 (06/03 0529) Resp:  [17-18] 18 (06/03 0529) BP: (125-147)/(59-68) 147/68 mmHg (06/03 0529) SpO2:  [95 %-100 %] 97 % (06/03 0529) Last BM Date: 01/18/16  Intake/Output from previous day: 06/02 0701 - 06/03 0700 In: 1080 [P.O.:1080] Out: 2660 [Urine:2650; Drains:10] Intake/Output this shift: Total I/O In: -  Out: 405 [Urine:400; Drains:5]  General appearance: Alert.  Pleasant.  Cooperative.  No distress. GI: Abdomen soft and nontender.  Wounds look fine.  JP drainage serosanguineous.  Lab Results:  Results for orders placed or performed during the hospital encounter of 01/10/16 (from the past 24 hour(s))  Glucose, capillary     Status: None   Collection Time: 01/18/16  7:36 AM  Result Value Ref Range   Glucose-Capillary 88 65 - 99 mg/dL  Glucose, capillary     Status: Abnormal   Collection Time: 01/18/16 11:46 AM  Result Value Ref Range   Glucose-Capillary 105 (H) 65 - 99 mg/dL  Glucose, capillary     Status: Abnormal   Collection Time: 01/18/16  5:17 PM  Result Value Ref Range   Glucose-Capillary 174 (H) 65 - 99 mg/dL  Glucose, capillary     Status: Abnormal   Collection Time: 01/18/16  9:37 PM  Result Value Ref Range   Glucose-Capillary 154 (H) 65 - 99 mg/dL   Comment 1 Notify RN      Studies/Results: No results found.  Marland Kitchen. amLODipine  5 mg Oral Daily  . aspirin EC  81 mg Oral Daily  . cefTRIAXone (ROCEPHIN)  IV  2 g Intravenous Q24H  . heparin  5,000 Units Subcutaneous Q8H  . insulin aspart  0-9 Units Subcutaneous TID WC  . metoprolol succinate  25 mg Oral Daily  . pravastatin  20 mg Oral q1800     Assessment/Plan: s/p Procedure(s): LAPAROSCOPIC CHOLECYSTECTOMY  WITH INTRAOPERATIVE CHOLANGIOGRAM  Gangrenous cholecystitis with abscess POD#7 -  laparoscopic cholecystectomy with IOC, 01/12/16, Dr. Luisa Hartornett  E coli bacteremia Leukocytosis  Mild troponin elevation Hypertension Dyslipidemia  AODM Chronic low back pain  FEN: Carb Modified ID: 4 days of Ceftriaxone completed, day 3 ertapenem, now back on Rocephin day 3       ID recommends 7 days of oral antibiotics such as cephalosporin to cover Escherichia coli as outpatient DVT: Heparin/SCD  CT a/p shows no significant abd issue. Small fluid collection which is expected and moreover drain is going thru it.  Will defer to medicine and ID about Rt pleural effusion.  Will defer to ID about duration/modality of abx (see above)  From surgical standpoint she meets discharge criteria, and I recommended that she be discharged today. Pt will go home with drain  She does not require analgesic prescription I have completed her discharge instructions Follow-up with Dr. Harriette Bouillonhomas Cornett in one week Nursing to teach drain care Nothing else  to add at this point  @PROBHOSP @  LOS: 9 days    Angel Gonzalez M 01/19/2016  . .prob

## 2016-01-19 NOTE — Progress Notes (Signed)
Drain care teaching given to patient and daughter.

## 2016-01-19 NOTE — Discharge Instructions (Signed)
You may shower Walk around the block every day Drink lots of water and fluids  No lifting more than 20 pounds  Call the [facility] and make an appointment see Dr. Luisa Hart in one week. Most likely we will remove the drain at the next office visit          [facility] ______[facility] LAPAROSCOPIC SURGERY: POST OP INSTRUCTIONS Always review your discharge instruction sheet given to you by the facility where your surgery was performed. IF YOU HAVE DISABILITY OR FAMILY LEAVE FORMS, YOU MUST BRING THEM TO THE OFFICE FOR PROCESSING.   DO NOT GIVE THEM TO YOUR DOCTOR.  1. A prescription for pain medication may be given to you upon discharge.  Take your pain medication as prescribed, if needed.  If narcotic pain medicine is not needed, then you may take acetaminophen (Tylenol) or ibuprofen (Advil) as needed. 2. Take your usually prescribed medications unless otherwise directed. 3. If you need a refill on your pain medication, please contact your pharmacy.  They will contact our office to request authorization. Prescriptions will not be filled after 5pm or on week-ends. 4. You should follow a light diet the first few days after arrival home, such as soup and crackers, etc.  Be sure to include lots of fluids daily. 5. Most patients will experience some swelling and bruising in the area of the incisions.  Ice packs will help.  Swelling and bruising can take several days to resolve.  6. It is common to experience some constipation if taking pain medication after surgery.  Increasing fluid intake and taking a stool softener (such as Colace) will usually help or prevent this problem from occurring.  A mild laxative (Milk of Magnesia or Miralax) should be taken according to package instructions if there are no bowel movements after 48 hours. 7. Unless discharge instructions indicate otherwise, you may remove your bandages 24-48 hours after surgery, and you may shower at  that time.  You may have steri-strips (small skin tapes) in place directly over the incision.  These strips should be left on the skin for 7-10 days.  If your surgeon used skin glue on the incision, you may shower in 24 hours.  The glue will flake off over the next 2-3 weeks.  Any sutures or staples will be removed at the office during your follow-up visit. 8. ACTIVITIES:  You may resume regular (light) daily activities beginning the next day--such as daily self-care, walking, climbing stairs--gradually increasing activities as tolerated.  You may have sexual intercourse when it is comfortable.  Refrain from any heavy lifting or straining until approved by your doctor. a. You may drive when you are no longer taking prescription pain medication, you can comfortably wear a seatbelt, and you can safely maneuver your car and apply brakes. b. RETURN TO WORK:  __________________________________________________________ 9. You should see your doctor in the office for a follow-up appointment approximately 2-3 weeks after your surgery.  Make sure that you call for this appointment within a day or two after you arrive home to insure a convenient appointment time. 10. OTHER INSTRUCTIONS: __________________________________________________________________________________________________________________________ __________________________________________________________________________________________________________________________ WHEN TO CALL YOUR DOCTOR: 1. Fever over 101.0 2. Inability to urinate 3. Continued bleeding from incision. 4. Increased pain, redness, or drainage from the incision. 5. Increasing abdominal pain  The clinic staff is available to answer your questions during regular business hours.  Please dont hesitate to call and ask to speak to one of the nurses for clinical  concerns.  If you have a medical emergency, go to the nearest emergency room or call 911.  A surgeon from [facility]  is always on call at the hospital. [address], [address], [place], [place]  [id number] ? [address] [address], [place], [place]   [id number] [phone] ? [phone] ? [phone] Web site: [email or web address]

## 2016-01-19 NOTE — Discharge Summary (Addendum)
Physician Discharge Summary  ZERAH HILYER ZOX:[id number] DOB: 23-Nov-1945 DOA: 01/10/2016  PCP: [facility]  Admit date: 01/10/2016 Discharge date: 01/19/2016  Time spent: 45 minutes  Recommendations for Outpatient Follow-up:  Patient will be discharged to home.  Patient will need to follow up with primary care provider within one week of discharge.  Follow up with Dr. Luisa Hart in one week. Patient should continue medications as prescribed.  Patient should follow a heart healthy/carb modified diet.   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 Ramonita Lab is a 70 y.o. female with medical history significant for hypertension, hyperlipidemia, type 2 diabetes mellitus, and chronic low back pain who presents as a direct admission, sent by Judee Clara, NP for evaluation of biliary colic. Patient reports being in her usual state of health until 01/06/2016 when there was insidious development of a dull ache in the right upper quadrant. This pain progressed over the course of approximately 30 minutes and became accompanied by nausea and vomiting before spontaneously resolving. Since that time, she's had recurrences in this pain with increasing frequency.She now reports a constant, dull ache at the right upper quadrant that is tolerable while at rest, but worse while moving around. She denies noticing any change in her symptoms with oral intake but notes that she has not been up to hold  much down due to the nausea and vomiting. She was evaluated for these complaints in the [facility] emergency department on 01/06/2016 with CT scan that demonstrated a 12 mm stone at the gallbladder neck, but per the provider notes, there was no evidence of acute cholecystitis and she was discharged home to follow-up with her primary care provider for abdominal ultrasound in her home town of [place]. Patient reports seeing Judee Clara, [name], being told she would need cholecystectomy, and was advised to present to registration at [facility] for direct admission.  Patient is evaluated on the surgical unit at [facility] where she is found to be afebrile, saturating well on room air, and with vital signs stable. She denies any pain currently, noting that she had some earlier this morning. She is in no apparent distress, nontoxic in appearance. She denies any pain currently, denies any recent chest pain, palpitations, dyspnea, or cough. There is been non-bloody vomiting, but no diarrhea. She denies any urinary symptoms and denies recent antibiotic use. She'll be placed in observation status for further evaluation and management of suspected biliary colic.  Hospital Course:  Biliary colic / Acute cholecystitis / leukocytosis -Abdominal ultrasound significant for acute cholecystitis -Normal liver function enzymes, normal lipase -S/P lap cholecystectomy 5/27 and drain placement -Worsening leukocytosis so surgery changed abx from rocephin to Invanz 01/14/2016 -ID recommended narrowing antibiotics, and changing to rocephin on 01/16/2016 -WBC 27.4 today -Diet as tolerated  -General surgery consulted and appreciated -CT Abd/pelvis 01/17/2016: 5 amount of low-attenuation material mixed bubbles of 8 or gallbladder fossa, no defined abscess. Trace amount of ascites right upper quadrant. Large right pleural effusion and associated right lower lobe atelectasis.  E.Coli and  Enterobacteriaceaebacteremia -Blood cultures  on admission growing E.Coli and Enterobacteriaceae species -Repeat blood cultures showed no growth so far -Antibiotics were changed to Invanz due to worsening leukocytosis  -leukocytosis improving  -Will discharge with Ceftin 500mg  BID for 7 days  Mild troponin elevation  -Likely secondary to demand ischemia in the setting of acute cholecystitis -Troponin 0.11, 0.03, 0.04 -No acute ischemic changes on admission 12-lead EKG -Echocardiogram EF 55-60%, grade 1 diastolic dysfunction -Cardiology consulted and appreciated. Recommended holding ACEI until renal function improves, outpatient follow up in 1-3 months, Aspirin 81mg   Right pleural effusion -Noted on CT abd/pelvis -After one dose of lasix, patient had good UOP -Will continue lasix PO 20mg  every other day for 3 days. Follow up with PCP in 1-2 weeks for repeat imaging.  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) -Hemoglobin A1c is 6.4 -Was placed on insulin sliding scale and CBG monitoring during hospitalization -Continue Invokana at discharge  Hypokalemia/Hypomagnesemia -Likely due to GI losses -Continue to replace -Magnesium 1.6 today, continue to replace, will discharge with oral magnesium -Repeat BMP and Magnesium in one week  Acute kidney injury -Creatinine 2.3, improving with hydration  -Cr on 5/21 was 1.4 but no other values for comparison  -Cr improving with hydration -Cr 1.21 today  Consultants General surgery Cardiology Infectious disease  Procedures  Echocardiogram Lap Cholecystectomy   Discharge Exam: Filed Vitals:   01/18/16 2135 01/19/16 0529  BP: 125/62 147/68  Pulse: 92 85  Temp: 100.2 F  (37.9 C) 99.1 F (37.3 C)  Resp: 17 18   Exam  General: Well developed, well nourished, no distress  HEENT: NCAT, mucous membranes moist.   Cardiovascular: S1 S2 auscultated, RRR. No murmurs  Respiratory: Clear to auscultation  Abdomen: Soft, nontender, nondistended, + bowel sounds  Extremities: warm dry without cyanosis clubbing or edema  Neuro: AAOx3, nonfocal  Psych: Normal affect and demeanor, pleasant  Discharge Instructions      Discharge Instructions    Call MD for:  difficulty breathing, headache or visual disturbances    Complete by:  As directed      Call MD for:  hives    Complete by:  As directed      Call MD for:  persistant dizziness or light-headedness    Complete by:  As directed      Call MD for:  persistant nausea and vomiting    Complete by:  As directed      Call MD for:  redness, tenderness, or signs of infection (pain, swelling, redness, odor or green/yellow discharge around incision site)    Complete by:  As directed      Call MD for:  severe uncontrolled pain    Complete by:  As directed      Call MD for:  temperature >100.4    Complete by:  As directed      Diet - low sodium heart healthy    Complete by:  As directed      Discharge instructions    Complete by:  As directed   CCS ______[facility] LAPAROSCOPIC SURGERY: POST OP INSTRUCTIONS Always review your discharge instruction sheet given to you by the facility where your surgery was performed. IF YOU HAVE DISABILITY OR FAMILY LEAVE FORMS, YOU MUST BRING THEM TO THE OFFICE  FOR PROCESSING.   DO NOT GIVE THEM TO YOUR DOCTOR.  A prescription for pain medication may be given to you upon discharge.  Take your pain medication as prescribed, if needed.  If narcotic pain medicine is not needed, then you may take acetaminophen (Tylenol) or ibuprofen (Advil) as needed. Take your usually prescribed medications unless otherwise directed. If you need a refill on your pain  medication, please contact your pharmacy.  They will contact our office to request authorization. Prescriptions will not be filled after 5pm or on week-ends. You should follow a light diet the first few days after arrival home, such as soup and crackers, etc.  Be sure to include lots of fluids daily. Most patients will experience some swelling and bruising in the area of the incisions.  Ice packs will help.  Swelling and bruising can take several days to resolve.  It is common to experience some constipation if taking pain medication after surgery.  Increasing fluid intake and taking a stool softener (such as Colace) will usually help or prevent this problem from occurring.  A mild laxative (Milk of Magnesia or Miralax) should be taken according to package instructions if there are no bowel movements after 48 hours. Unless discharge instructions indicate otherwise, you may remove your bandages 24-48 hours after surgery, and you may shower at that time.  You may have steri-strips (small skin tapes) in place directly over the incision.  These strips should be left on the skin for 7-10 days.  If your surgeon used skin glue on the incision, you may shower in 24 hours.  The glue will flake off over the next 2-3 weeks.  Any sutures or staples will be removed at the office during your follow-up visit. ACTIVITIES:  You may resume regular (light) daily activities beginning the next day-such as daily self-care, walking, climbing stairs-gradually increasing activities as tolerated.  You may have sexual intercourse when it is comfortable.  Refrain from any heavy lifting or straining until approved by your doctor. You may drive when you are no longer taking prescription pain medication, you can comfortably wear a seatbelt, and you can safely maneuver your car and apply brakes. RETURN TO WORK:  __________________________________________________________ Bonita Quin should see your doctor in the office for a follow-up appointment  approximately 2-3 weeks after your surgery.  Make sure that you call for this appointment within a day or two after you arrive home to insure a convenient appointment time. OTHER INSTRUCTIONS: __________________________________________________________________________________________________________________________ __________________________________________________________________________________________________________________________ WHEN TO CALL YOUR DOCTOR: Fever over 101.0 Inability to urinate Continued bleeding from incision. Increased pain, redness, or drainage from the incision. Increasing abdominal pain  The clinic staff is available to answer your questions during regular business hours.  Please don't hesitate to call and ask to speak to one of the nurses for clinical concerns.  If you have a medical emergency, go to the nearest emergency room or call 911.  A surgeon from [facility] is always on call at the hospital. [address], [address], [place], [place]  [address] ? [address] [address], [place], [place]   [address] [phone] ? [phone] ? FAX [phone] Web site: [email or web address]     Discharge wound care:    Complete by:  As directed   Keep a written record of the drainage and bring that to the office with you  You may shower, but otherwise keep wounds clean and dry     Driving Restrictions    Complete by:  As directed  1 week     Increase activity slowly    Complete by:  As directed      Lifting restrictions    Complete by:  As directed   20 lbs.     May shower / Bathe    Complete by:  As directed      May walk up steps    Complete by:  As directed             Medication List    STOP taking these medications        oxyCODONE-acetaminophen 5-325 MG tablet  Commonly known as:  PERCOCET      TAKE these medications        allopurinol 300 MG tablet  Commonly known as:  ZYLOPRIM  Take 300 mg by mouth daily.     amLODipine  5 MG tablet  Commonly known as:  NORVASC  Take 5 mg by mouth daily.     aspirin 81 MG EC tablet  Take 1 tablet (81 mg total) by mouth daily.     cefUROXime 500 MG tablet  Commonly known as:  CEFTIN  Take 1 tablet (500 mg total) by mouth 2 (two) times daily with a meal.     cholecalciferol 1000 units tablet  Commonly known as:  VITAMIN D  Take 1,000 Units by mouth daily.     FIBER SELECT GUMMIES PO  Take 1 tablet by mouth daily as needed (Constipation).     furosemide 20 MG tablet  Commonly known as:  LASIX  Take 1 tablet (20 mg total) by mouth daily.     INVOKANA 100 MG Tabs tablet  Generic drug:  canagliflozin  Take 100 mg by mouth daily before breakfast.     losartan 100 MG tablet  Commonly known as:  COZAAR  Take 100 mg by mouth daily.     magnesium oxide 400 MG tablet  Commonly known as:  MAG-OX  Take 1 tablet (400 mg total) by mouth daily.     metoprolol succinate 25 MG 24 hr tablet  Commonly known as:  TOPROL-XL  Take 25 mg by mouth daily.     ondansetron 8 MG tablet  Commonly known as:  ZOFRAN  Take 1 tablet (8 mg total) by mouth every 4 (four) hours as needed.     pravastatin 20 MG tablet  Commonly known as:  PRAVACHOL  Take 20 mg by mouth daily.       Allergies  Allergen Reactions  . Penicillins Hives   Follow-up Information    Follow up with [facility] S, MD. Schedule an appointment as soon as possible for a visit in 1 month.   Specialty:  Cardiology   Contact information:   [address] [place] [address] [phone]       Follow up with CORNETT,THOMAS A., MD. Schedule an appointment as soon as possible for a visit in 1 week.   Specialty:  General Surgery   Contact information:   [address] [place] [address] [phone]       Schedule an appointment as soon as possible for a visit with [facility].   Why:  One week, hospital follow up   Contact information:   [address] [place] [address] [phone]        The results of significant diagnostics from this hospitalization (including imaging, microbiology, ancillary and laboratory) are listed below for reference.    Significant Diagnostic Studies:  Dg Chest 2 View  01/19/2016  CLINICAL DATA:  Pleural effusion. Recent history of cholecystectomy. EXAM: CHEST  2 VIEW COMPARISON:  01/06/2016 FINDINGS: Right upper quadrant abdominal drain in place. Prior cholecystectomy. Moderate to large right pleural effusion, new since prior study. Right lung airspace opacity, likely atelectasis. Left lung is clear. Heart is normal size. IMPRESSION: Moderate to large right pleural effusion with right lung airspace opacity, likely atelectasis. Electronically Signed   By: Charlett NoseKevin  Dover M.D.   On: 01/19/2016 10:24   Dg Cholangiogram Operative  01/12/2016  CLINICAL DATA:  Laparoscopic cholecystectomy EXAM: INTRAOPERATIVE CHOLANGIOGRAM TECHNIQUE: Cholangiographic image from the C-arm fluoroscopic device submitted for interpretation post-operatively. Please see the procedural report for the amount of contrast and the fluoroscopy time utilized. COMPARISON:  None. FINDINGS: Some motion degradation limits resolution. No definite filling defects in the common duct. Intrahepatic ducts are incompletely visualized, appearing decompressed centrally. Contrast passes into the duodenum. : Negative for retained common duct stone. Electronically Signed   By: Corlis Leak  Hassell M.D.   On: 01/12/2016 09:09   [place] Abdomen Complete  01/11/2016  CLINICAL DATA:  Biliary colic followup EXAM: ABDOMEN ULTRASOUND COMPLETE COMPARISON:  01/06/2016. FINDINGS: Gallbladder: Diffuse gallbladder wall thickening is noted which measures up to 9 mm. Multiple stones are identified within the gallbladder as well as gallbladder sludge. The stones measure up to 11 mm. Pericholecystic fluid is noted. Common bile duct: Diameter: 8.7 mm Liver: The liver has a heterogeneous echotexture. IVC:  No abnormality visualized. Pancreas: Visualized portion unremarkable. Spleen: Size and appearance within normal limits. Right Kidney: Length: 11.7 cm. Perinephric fluid noted. Echogenicity within normal limits. No mass or hydronephrosis visualized. Left Kidney: Length: 10.4 cm. Echogenicity within normal limits. No mass or hydronephrosis visualized. Abdominal aorta: No aneurysm visualized. Other findings: Left pleural effusion noted. IMPRESSION: 1. Gallstones, gallbladder sludge and diffuse gallbladder wall thickening and pericholecystic fluid compatible with acute cholecystitis. 2. Hepatic steatosis suspected. 3. Left pleural effusion 4. Right perinephric fluid noted without hydronephrosis. Electronically Signed   By: Signa Kellaylor  Stroud M.D.   On: 01/11/2016 08:19   Ct Abdomen Pelvis W Contrast  01/17/2016  CLINICAL DATA:  Status post cholecystectomy 4 days ago for cholecystitis. Drainage of intra-abdominal abscess. Rising white blood cell count. EXAM: CT ABDOMEN AND PELVIS WITH CONTRAST TECHNIQUE: Multidetector CT imaging of the abdomen and pelvis was performed using the standard protocol following bolus administration of intravenous contrast. CONTRAST:  100mL ISOVUE-300 IOPAMIDOL (ISOVUE-300) INJECTION 61% COMPARISON:  01/06/2016 FINDINGS: Lung bases: Large right pleural effusion with atelectasis of the right lower lobe and partial right middle lobe atelectasis. Small left pleural effusion. Heart normal in size. Hepatobiliary: In the gallbladder fossa, there is low attenuation material mixed with bubbles of air measuring 3.6 x 2.3 cm transversely. A drainage catheter lies along the posterior/inferior margin of the liver. There is some ill-defined low attenuation within the liver, anterior segment of the right lobe, extending from the gallbladder fossa, but no evidence of a defined liver abscess. Remainder of the liver is unremarkable. Common bile duct measures 9 mm in its midportion. It tapers through the  pancreatic head. No CT evidence of a duct stone. Spleen, pancreas, adrenal glands:  Unremarkable. Kidneys, ureters, bladder: 13 mm right midpole cyst. No other renal masses. No hydronephrosis. Ureters are normal course and caliber. Bladder is unremarkable. Uterus and adnexa: There are uterine calcifications likely within small fibroids. Possible submucosal fibroid versus polyp arising from the right uterine fundus. No adnexal masses. Lymph nodes:  No adenopathy.  Ascites:  Trace ascites noted in the right upper quadrant. Gastrointestinal: Normal stomach and small bowel. Numerous left colon diverticula. No diverticulitis. Colon otherwise unremarkable. Normal appendix visualized. Musculoskeletal: Degenerative changes noted in the lower lumbar spine. No osteoblastic or osteolytic lesions. IMPRESSION: 1. Small amount of low attenuation material mixed with bubbles of air noted in the gallbladder fossa, but no defined abscess. Trace amount of ascites noted in the right upper quadrant. 2. Large right pleural effusion with associated right lower lobe atelectasis and partial right middle lobe atelectasis. Small left pleural effusion. Electronically Signed   By: Amie Portland M.D.   On: 01/17/2016 17:40   Dg Chest Portable 1 View  01/06/2016  CLINICAL DATA:  Abdominal pain, upper mid back pain, nausea and vomiting today. EXAM: PORTABLE CHEST 1 VIEW COMPARISON:  None. FINDINGS: Cardiomediastinal silhouette is normal in size and configuration. Lungs are clear. Lung volumes are normal. No evidence of pneumonia. No pleural effusion. No pneumothorax. Osseous and soft tissue structures about the chest are unremarkable. IMPRESSION: Lungs are clear and there is no evidence of acute cardiopulmonary abnormality. Electronically Signed   By: Bary Richard M.D.   On: 01/06/2016 19:10   Ct Renal Stone Study  01/06/2016  CLINICAL DATA:  Sudden onset of low back pain, central, with nausea and vomiting. EXAM: CT ABDOMEN AND PELVIS  WITHOUT CONTRAST TECHNIQUE: Multidetector CT imaging of the abdomen and pelvis was performed following the standard protocol without IV contrast. COMPARISON:  None. FINDINGS: Lower chest:  No acute findings. Hepatobiliary: There is a 12 mm stone in the gallbladder neck region. Gallbladder is moderately distended but there is no pericholecystic inflammation or obvious gallbladder wall thickening. Liver is unremarkable. Pancreas: No mass or inflammatory process identified on this un-enhanced exam. Spleen: Within normal limits in size. Adrenals/Urinary Tract: Adrenal glands appear normal. Kidneys are unremarkable without stone or hydronephrosis. No ureteral or bladder calculi identified. Bladder is moderately distended but otherwise unremarkable. Stomach/Bowel: There is extensive diverticulosis throughout the descending and sigmoid colon, an additional mild diverticulosis within the transverse and ascending colon. No focal inflammatory change appreciated to suggest acute diverticulitis. No pericolic fluid or inflammation. No dilated large or small bowel loops. Stomach appears normal. Appendix is normal. Vascular/Lymphatic: No pathologically enlarged lymph nodes. No evidence of abdominal aortic aneurysm. Reproductive: Tubal ligation clips in place, grossly well position. Small calcified uterine fibroids. No mass or fluid collection within either adnexal region. Other: No free fluid or abscess collections seen. No free intraperitoneal air. Musculoskeletal: Degenerative changes of the thoracolumbar spine. No evidence of acute osseous abnormality. Combination of disc bulges and degenerative facet hypertrophy at the L3-4 through L5-S1 levels are causing moderate to severe central canal stenoses with possible associated nerve root impingement. Superficial soft tissues are unremarkable. IMPRESSION: 1. Extensive colonic diverticulosis without evidence of acute diverticulitis. No bowel obstruction or evidence of bowel wall  inflammation seen. 2. 12 mm stone in the gallbladder neck region. Gallbladder is moderately distended but there is no pericholecystic fluid or other secondary signs of acute cholecystitis seen. If any localizable right upper quadrant pain, would consider gallbladder ultrasound to exclude early developing cholecystitis. There is also at least mild common bile duct dilatation, of uncertain significance, which could also be better characterized with right upper quadrant ultrasound. 3. Fairly extensive degenerative change in the lower lumbar spine, with disc bulges and degenerative facet hypertrophy at the L3-4 through L5-S1 levels causing moderate-to-severe central canal stenoses with possible associated nerve root impingements. If any radiculopathic symptoms,  would consider nonemergent lumbar spine MRI for further characterization. 4. Small uterine fibroids.  Tubal ligation clips. 5. Remainder of the abdomen and pelvis CT is unremarkable. No free fluid. No renal or ureteral calculi. Electronically Signed   By: Bary Richard M.D.   On: 01/06/2016 21:50    Microbiology: Recent Results (from the past 240 hour(s))  Culture, blood (routine x 2)     Status: Abnormal   Collection Time: 01/10/16 11:20 PM  Result Value Ref Range Status   Specimen Description BLOOD LEFT ARM  Final   Special Requests BOTTLES DRAWN AEROBIC AND ANAEROBIC  Final   Culture  Setup Time   Final    GRAM NEGATIVE RODS IN BOTH AEROBIC AND ANAEROBIC BOTTLES CRITICAL RESULT CALLED TO, READ BACK BY AND VERIFIED WITH: Despina Pole AT 1509 01/11/16 BY L BENFIELD    Culture ESCHERICHIA COLI (A)  Final   Report Status 01/13/2016 FINAL  Final   Organism ID, Bacteria ESCHERICHIA COLI  Final      Susceptibility   Escherichia coli - MIC*    AMPICILLIN >=32 RESISTANT Resistant     CEFAZOLIN 8 SENSITIVE Sensitive     CEFEPIME <=1 SENSITIVE Sensitive     CEFTAZIDIME <=1 SENSITIVE Sensitive     CEFTRIAXONE <=1 SENSITIVE Sensitive      CIPROFLOXACIN <=0.25 SENSITIVE Sensitive     GENTAMICIN <=1 SENSITIVE Sensitive     IMIPENEM <=0.25 SENSITIVE Sensitive     TRIMETH/SULFA <=20 SENSITIVE Sensitive     AMPICILLIN/SULBACTAM 16 INTERMEDIATE Intermediate     PIP/TAZO <=4 SENSITIVE Sensitive     * ESCHERICHIA COLI  Blood Culture ID Panel (Reflexed)     Status: Abnormal   Collection Time: 01/10/16 11:20 PM  Result Value Ref Range Status   Enterococcus species NOT DETECTED NOT DETECTED Final   Vancomycin resistance NOT DETECTED NOT DETECTED Final   Listeria monocytogenes NOT DETECTED NOT DETECTED Final   Staphylococcus species NOT DETECTED NOT DETECTED Final   Staphylococcus aureus NOT DETECTED NOT DETECTED Final   Methicillin resistance NOT DETECTED NOT DETECTED Final   Streptococcus species NOT DETECTED NOT DETECTED Final   Streptococcus agalactiae NOT DETECTED NOT DETECTED Final   Streptococcus pneumoniae NOT DETECTED NOT DETECTED Final   Streptococcus pyogenes NOT DETECTED NOT DETECTED Final   Acinetobacter baumannii NOT DETECTED NOT DETECTED Final   Enterobacteriaceae species DETECTED (A) NOT DETECTED Final    Comment: CRITICAL RESULT CALLED TO, READ BACK BY AND VERIFIED WITH: M MANCHERIL,PHARMD AT 1509 01/11/16 BY L BENFIELD    Enterobacter cloacae complex NOT DETECTED NOT DETECTED Final   Escherichia coli DETECTED (A) NOT DETECTED Final    Comment: CRITICAL RESULT CALLED TO, READ BACK BY AND VERIFIED WITH: M MANCHERIL,PHARMD AT 1509 01/11/16 BY L BENFIELD    Klebsiella oxytoca NOT DETECTED NOT DETECTED Final   Klebsiella pneumoniae NOT DETECTED NOT DETECTED Final   Proteus species NOT DETECTED NOT DETECTED Final   Serratia marcescens NOT DETECTED NOT DETECTED Final   Carbapenem resistance NOT DETECTED NOT DETECTED Final   Haemophilus influenzae NOT DETECTED NOT DETECTED Final   Neisseria meningitidis NOT DETECTED NOT DETECTED Final   Pseudomonas aeruginosa NOT DETECTED NOT DETECTED Final   Candida albicans NOT  DETECTED NOT DETECTED Final   Candida glabrata NOT DETECTED NOT DETECTED Final   Candida krusei NOT DETECTED NOT DETECTED Final   Candida parapsilosis NOT DETECTED NOT DETECTED Final   Candida tropicalis NOT DETECTED NOT DETECTED Final  Culture, blood (routine x 2)  Status: Abnormal   Collection Time: 01/10/16 11:30 PM  Result Value Ref Range Status   Specimen Description BLOOD RIGHT ARM  Final   Special Requests BOTTLES DRAWN AEROBIC AND ANAEROBIC  Final   Culture  Setup Time   Final    GRAM NEGATIVE RODS IN BOTH AEROBIC AND ANAEROBIC BOTTLES CRITICAL RESULT CALLED TO, READ BACK BY AND VERIFIED WITH: M MANCHERIL,PHARMD AT 1509 01/11/16 BY L BENFIELD    Culture (A)  Final    ESCHERICHIA COLI SUSCEPTIBILITIES PERFORMED ON PREVIOUS CULTURE WITHIN THE LAST 5 DAYS.    Report Status 01/13/2016 FINAL  Final  Surgical pcr screen     Status: None   Collection Time: 01/11/16  4:25 PM  Result Value Ref Range Status   MRSA, PCR NEGATIVE NEGATIVE Final   Staphylococcus aureus NEGATIVE NEGATIVE Final    Comment:        The Xpert SA Assay (FDA approved for NASAL specimens in patients over 83 years of age), is one component of a comprehensive surveillance program.  Test performance has been validated by [facility] for patients greater than or equal to 2 year old. It is not intended to diagnose infection nor to guide or monitor treatment.   Culture, blood (routine x 2)     Status: None   Collection Time: 01/13/16  9:22 AM  Result Value Ref Range Status   Specimen Description BLOOD LEFT ARM  Final   Special Requests BOTTLES DRAWN AEROBIC AND ANAEROBIC 10CC  Final   Culture NO GROWTH 5 DAYS  Final   Report Status 01/18/2016 FINAL  Final  Culture, blood (routine x 2)     Status: None   Collection Time: 01/13/16  9:33 AM  Result Value Ref Range Status   Specimen Description BLOOD LEFT HAND  Final   Special Requests IN PEDIATRIC BOTTLE 1CC  Final   Culture NO GROWTH 5 DAYS  Final    Report Status 01/18/2016 FINAL  Final     Labs: Basic Metabolic Panel:  Recent Labs Lab 01/15/16 0501 01/16/16 0456 01/17/16 0245 01/17/16 0740 01/18/16 0550 01/19/16 0530  NA 140 138 137  --  140 139  K 3.3* 3.4* 3.1*  --  3.2* 4.0  CL 109 105 106  --  106 104  CO2 22 25 24   --  24 24  GLUCOSE 102* 102* 102*  --  98 104*  BUN 15 14 12   --  15 13  CREATININE 1.20* 1.27* 1.22*  --  1.25* 1.21*  CALCIUM 8.3* 8.2* 8.0*  --  8.4* 8.4*  MG  --  1.4*  --  1.7 1.9 1.6*   Liver Function Tests: No results for input(s): AST, ALT, ALKPHOS, BILITOT, PROT, ALBUMIN in the last 168 hours. No results for input(s): LIPASE, AMYLASE in the last 168 hours. No results for input(s): AMMONIA in the last 168 hours. CBC:  Recent Labs Lab 01/15/16 0501 01/16/16 0456 01/17/16 0245 01/18/16 0550 01/19/16 0530 01/19/16 0840  WBC 24.6* 27.1* 27.4* 23.4*  --  18.6*  NEUTROABS  --   --  22.2* 18.7* 13.6*  --   HGB 11.4* 11.8* 11.3* 11.1*  --  11.9*  HCT 35.7* 36.4 34.3* 34.4*  --  36.8  MCV 94.9 92.9 93.2 91.7  --  94.1  PLT 263 323 307 379  --  395   Cardiac Enzymes: No results for input(s): CKTOTAL, CKMB, CKMBINDEX, TROPONINI in the last 168 hours. BNP: BNP (last 3 results) No  results for input(s): BNP in the last 8760 hours.  ProBNP (last 3 results) No results for input(s): PROBNP in the last 8760 hours.  CBG:  Recent Labs Lab 01/18/16 0736 01/18/16 1146 01/18/16 1717 01/18/16 2137 01/19/16 0749  GLUCAP 88 105* 174* 154* 112*       Signed:  Maejor Erven  [facility] 01/19/2016, 10:34 AM

## 2016-07-30 NOTE — ED Provider Notes (Signed)
[facility]-EMERGENCY DEPT Provider Note   CSN: [id number] Arrival date & time: 01/06/16  1800     History   Chief Complaint Chief Complaint  Patient presents with  . Emesis  . Back Pain    HPI Angel Gonzalez is a 70 y.o. female.  HPI Pt states she had sudden onset of low back pain, central in nature, with nausea and vomiting.  Began about an hour pta. Past Medical History:  Diagnosis Date  . Diabetes mellitus without complication (HCC)   . Hypertension     Patient Active Problem List   Diagnosis Date Noted  . Cholecystitis, acute s/p lap chole 01/12/16 01/13/2016  . Acute gangrenous cholecystitis 01/13/2016  . Hypokalemia 01/11/2016  . AKI (acute kidney injury) (HCC) 01/11/2016  . Essential hypertension 01/10/2016  . Hyperlipidemia 01/10/2016  . Non-insulin dependent type 2 diabetes mellitus (HCC) 01/10/2016  . Cholelithiasis 01/10/2016  . Biliary colic 01/10/2016  . Chronic low back pain 01/10/2016    Past Surgical History:  Procedure Laterality Date  . CHOLECYSTECTOMY N/A 01/12/2016   Procedure: LAPAROSCOPIC CHOLECYSTECTOMY WITH INTRAOPERATIVE CHOLANGIOGRAM;  Surgeon: Harriette Bouillonhomas Cornett, MD;  Location: [facility] OR;  Service: General;  Laterality: N/A;    OB History    No data available       Home Medications    Prior to Admission medications   Medication Sig Start Date End Date Taking? Authorizing Provider  allopurinol (ZYLOPRIM) 300 MG tablet Take 300 mg by mouth daily.   Yes Historical Provider, MD  amLODipine (NORVASC) 5 MG tablet Take 5 mg by mouth daily.   Yes Historical Provider, MD  canagliflozin (INVOKANA) 100 MG TABS tablet Take 100 mg by mouth daily before breakfast.   Yes Historical Provider, MD  cholecalciferol (VITAMIN D) 1000 units tablet Take 1,000 Units by mouth daily.   Yes Historical Provider, MD  FIBER SELECT GUMMIES PO Take 1 tablet by mouth daily as needed (Constipation).   Yes Historical Provider, MD  losartan (COZAAR) 100 MG tablet Take 100 mg by  mouth daily.   Yes Historical Provider, MD  metoprolol succinate (TOPROL-XL) 25 MG 24 hr tablet Take 25 mg by mouth daily.   Yes Historical Provider, MD  pravastatin (PRAVACHOL) 20 MG tablet Take 20 mg by mouth daily.   Yes Historical Provider, MD  aspirin EC 81 MG EC tablet Take 1 tablet (81 mg total) by mouth daily. 01/19/16   Maryann Mikhail, DO  cefUROXime (CEFTIN) 500 MG tablet Take 1 tablet (500 mg total) by mouth 2 (two) times daily with a meal. 01/19/16   Maryann Mikhail, DO  furosemide (LASIX) 20 MG tablet Take 1 tablet (20 mg total) by mouth daily. 01/19/16   Maryann Mikhail, DO  magnesium oxide (MAG-OX) 400 MG tablet Take 1 tablet (400 mg total) by mouth daily. 01/19/16   Maryann Mikhail, DO  ondansetron (ZOFRAN) 8 MG tablet Take 1 tablet (8 mg total) by mouth every 4 (four) hours as needed. 01/06/16   Donnetta HutchingBrian Cook, MD    Family History Family History  Problem Relation Age of Onset  . Diabetes type II Other   . Hypertension Other     Social History Social History  Substance Use Topics  . Smoking status: Never Smoker  . Smokeless tobacco: Not on file  . Alcohol use No     Allergies   Penicillins   Review of Systems Review of Systems  Constitutional: Negative for fever.  Genitourinary: Negative for difficulty urinating, frequency and urgency.  Musculoskeletal: Positive  for back pain.  All other systems reviewed and are negative.    Physical Exam Updated Vital Signs BP 154/83   Pulse 80   Resp 17   SpO2 98%   Physical Exam  Constitutional: She is oriented to person, place, and time. She appears well-developed and well-nourished. No distress.  HENT:  Head: Normocephalic and atraumatic.  Eyes: Pupils are equal, round, and reactive to light.  Neck: Normal range of motion.  Cardiovascular: Normal rate and intact distal pulses.   Pulmonary/Chest: Effort normal and breath sounds normal. No respiratory distress.  Abdominal: Soft. Normal appearance and bowel sounds are  normal. She exhibits no distension. There is tenderness in the right upper quadrant. There is no rigidity, no rebound, no guarding and no CVA tenderness.  Musculoskeletal: Normal range of motion.  Neurological: She is alert and oriented to person, place, and time. No cranial nerve deficit.  Skin: Skin is warm and dry. No rash noted.  Psychiatric: She has a normal mood and affect. Her behavior is normal.  Nursing note and vitals reviewed.    ED Treatments / Results  Labs (all labs ordered are listed, but only abnormal results are displayed) Labs Reviewed  COMPREHENSIVE METABOLIC PANEL - Abnormal; Notable for the following:       Result Value   Potassium 3.1 (*)    Glucose, Bld 120 (*)    BUN 28 (*)    Creatinine, Ser 1.44 (*)    Total Protein 8.4 (*)    GFR calc non Af Amer 36 (*)    GFR calc Af Amer 42 (*)    All other components within normal limits  URINALYSIS, ROUTINE W REFLEX MICROSCOPIC (NOT AT [facility]) - Abnormal; Notable for the following:    Glucose, UA >1000 (*)    Hgb urine dipstick TRACE (*)    Leukocytes, UA TRACE (*)    All other components within normal limits  URINE MICROSCOPIC-ADD ON - Abnormal; Notable for the following:    Squamous Epithelial / LPF 0-5 (*)    Bacteria, UA RARE (*)    All other components within normal limits  I-STAT CHEM 8, ED - Abnormal; Notable for the following:    Potassium 3.2 (*)    BUN 33 (*)    Creatinine, Ser 1.40 (*)    Glucose, Bld 120 (*)    Hemoglobin 17.3 (*)    HCT 51.0 (*)    All other components within normal limits  LIPASE, BLOOD  CBC WITH DIFFERENTIAL/PLATELET  I-STAT TROPOININ, ED    EKG  EKG Interpretation  Date/Time:  Sunday Jan 06 2016 18:48:24 EDT Ventricular Rate:  78 PR Interval:  213 QRS Duration: 94 QT Interval:  390 QTC Calculation: 444 R Axis:   15 Text Interpretation:  Sinus rhythm Borderline prolonged PR interval Probable left atrial enlargement Low voltage, precordial leads Confirmed by [facility] MD,  Barbara Cower ([id number]) on 01/08/2016 4:31:40 AM       Radiology No results found.  Procedures Procedures (including critical care time)  Medications Ordered in ED Medications  ondansetron (ZOFRAN) injection 4 mg (4 mg Intravenous Given 01/06/16 1847)  fentaNYL (SUBLIMAZE) injection 50 mcg (50 mcg Intravenous Given 01/06/16 1847)  fentaNYL (SUBLIMAZE) injection 25 mcg (25 mcg Intravenous Given 01/06/16 2054)  fentaNYL (SUBLIMAZE) injection 50 mcg (50 mcg Intravenous Given 01/06/16 2257)  ondansetron (ZOFRAN) injection 4 mg (4 mg Intravenous Given 01/06/16 2256)     Initial Impression / Assessment and Plan / ED Course  I have reviewed  the triage vital signs and the nursing notes.  Pertinent labs & imaging results that were available during my care of the patient were reviewed by me and considered in my medical decision making (see chart for details).  Clinical Course   Turned patient over to Dr. Donnetta HutchingBrian Cook awaiting CT results and dispostion   Final Clinical Impressions(s) / ED Diagnoses   RUQ Pain  New Prescriptions Discharge Medication List as of 01/06/2016 11:03 PM    START taking these medications   Details  ondansetron (ZOFRAN) 8 MG tablet Take 1 tablet (8 mg total) by mouth every 4 (four) hours as needed., Starting 01/06/2016, Until Discontinued, Print    oxyCODONE-acetaminophen (PERCOCET) 5-325 MG tablet Take 1-2 tablets by mouth every 4 (four) hours as needed., Starting 01/06/2016, Until Discontinued, Print         Nelva Nayobert Marshia Tropea, MD 07/30/16 2324

## 2017-07-22 IMAGING — RF DG CHOLANGIOGRAM OPERATIVE
1 series · 1 of 1 positions shown · non-contrast
Comparison: None.

CLINICAL DATA: Laparoscopic cholecystectomy

EXAM:
INTRAOPERATIVE CHOLANGIOGRAM
TECHNIQUE: Cholangiographic image from the C-arm fluoroscopic device submitted
for interpretation post-operatively. Please see the procedural
report for the amount of contrast and the fluoroscopy time utilized.

[Series 1: run · 1 of 1 slices shown]
[im 1/1]
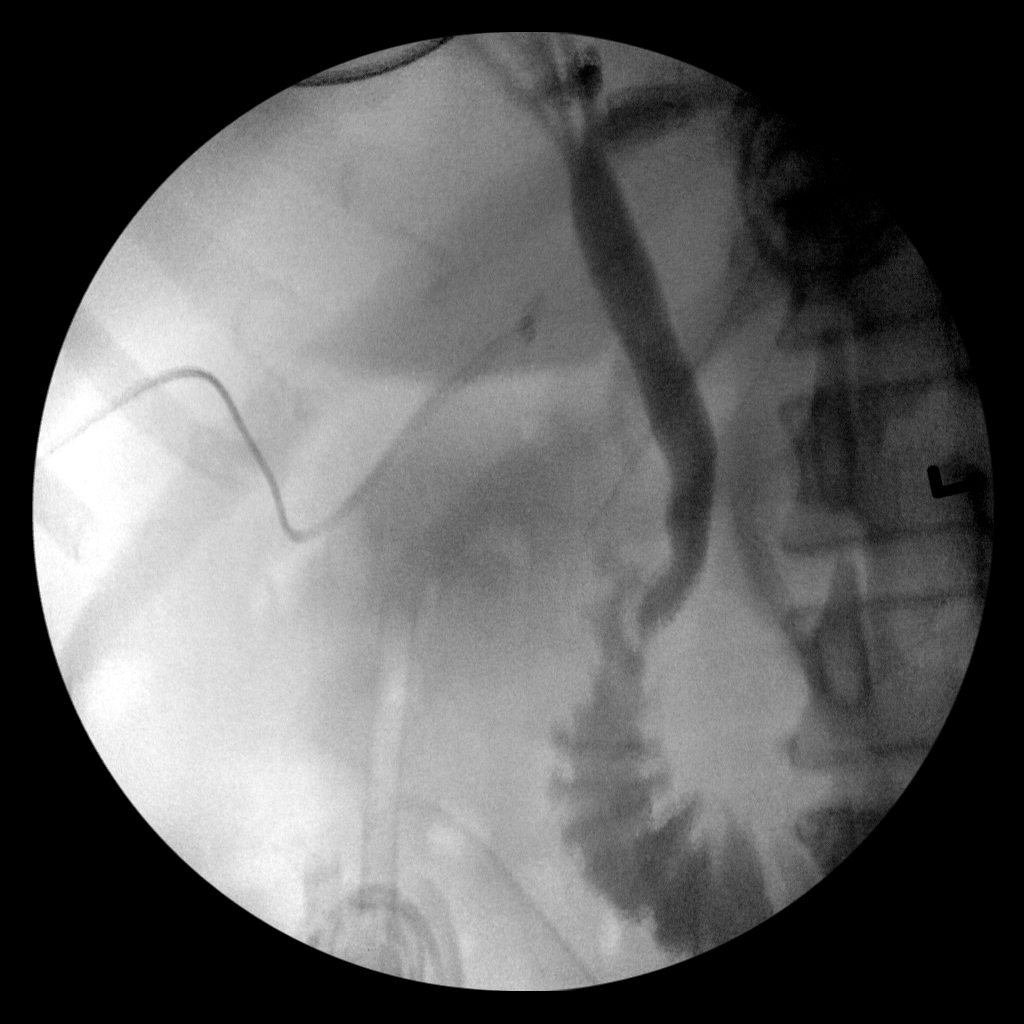

[1 of 1 positions shown; findings below may reference images not displayed]

FINDINGS: Some motion degradation limits resolution. No definite filling
defects in the common duct. Intrahepatic ducts are incompletely
visualized, appearing decompressed centrally. Contrast passes into
the duodenum.

:
Negative for retained common duct stone.

## 2019-09-25 ENCOUNTER — Other Ambulatory Visit: Payer: Self-pay

## 2019-09-25 ENCOUNTER — Ambulatory Visit: Payer: Medicare Other | Attending: Internal Medicine

## 2019-09-25 DIAGNOSIS — Z23 Encounter for immunization: Secondary | ICD-10-CM | POA: Insufficient documentation

## 2019-09-25 NOTE — Progress Notes (Signed)
   Covid-19 Vaccination Clinic  Name:  CENIYAH THORP    MRN: [id number] DOB: 19-Jul-1946  09/25/2019  Ms. Sinopoli was observed post Covid-19 immunization for 15 minutes without incidence. She was provided with Vaccine Information Sheet and instruction to access the V-Safe system.   Ms. Drotar was instructed to call 911 with any severe reactions post vaccine: Marland Kitchen Difficulty breathing  . Swelling of your face and throat  . A fast heartbeat  . A bad rash all over your body  . Dizziness and weakness    Immunizations Administered    Name Date Dose VIS Date Route   Moderna COVID-19 Vaccine 09/25/2019  3:51 PM 0.5 mL 07/19/2019 Intramuscular   Manufacturer: Moderna   Lot: [id number]   NDC: 52778-242-35

## 2019-10-26 ENCOUNTER — Ambulatory Visit: Payer: Medicare Other | Attending: Internal Medicine

## 2019-10-26 DIAGNOSIS — Z23 Encounter for immunization: Secondary | ICD-10-CM | POA: Insufficient documentation

## 2019-10-26 NOTE — Progress Notes (Signed)
   Covid-19 Vaccination Clinic  Name:  Angel Gonzalez    MRN: [id number] DOB: 06/29/1946  10/26/2019  [name] was observed post Covid-19 immunization for 30 minutes based on pre-vaccination screening without incident. She was provided with Vaccine Information Sheet and instruction to access the V-Safe system.   [name] was instructed to call 911 with any severe reactions post vaccine: Marland Kitchen Difficulty breathing  . Swelling of face and throat  . A fast heartbeat  . A bad rash all over body  . Dizziness and weakness   Immunizations Administered    Name Date Dose VIS Date Route   Moderna COVID-19 Vaccine 10/26/2019  1:25 PM 0.5 mL 07/19/2019 Intramuscular   Manufacturer: Moderna   Lot: [id number]   NDC: 47159-539-67

## 2021-03-15 ENCOUNTER — Ambulatory Visit (INDEPENDENT_AMBULATORY_CARE_PROVIDER_SITE_OTHER): Payer: Medicare HMO | Admitting: Orthopaedic Surgery

## 2021-03-15 ENCOUNTER — Ambulatory Visit: Payer: Self-pay

## 2021-03-15 ENCOUNTER — Encounter: Payer: Self-pay | Admitting: Orthopaedic Surgery

## 2021-03-15 VITALS — BP 155/88 | HR 77 | Ht 62.0 in | Wt 165.0 lb

## 2021-03-15 DIAGNOSIS — M545 Low back pain, unspecified: Secondary | ICD-10-CM | POA: Diagnosis not present

## 2021-03-15 DIAGNOSIS — G8929 Other chronic pain: Secondary | ICD-10-CM

## 2021-03-18 NOTE — Progress Notes (Signed)
Office Visit Note   Patient: Angel Gonzalez           Date of Birth: Jun 25, 1946           MRN: [id number] Visit Date: 03/15/2021              Requested by: Medicine, [facility] [address] [place],  [place] [address] PCP: Medicine, [facility]   Assessment & Plan: Visit Diagnoses:  1. Chronic left-sided low back pain, unspecified whether sciatica present     Plan: Patient can follow-up next week with her report and disc so we can review these.  Her symptoms suggest he has some neurogenic claudication and may have some spinal stenosis since she has pain with standing relief with sitting, better leaning on over a grocery cart and problems after prolonged standing.  Follow-Up Instructions: No follow-ups on file.   Orders:  Orders Placed This Encounter  Procedures   XR Lumbar Spine 2-3 Views   No orders of the defined types were placed in this encounter.     Procedures: No procedures performed   Clinical Data: No additional findings.   Subjective: Chief Complaint  Patient presents with   Lower Back - Pain    HPI 75 year old female here with chronic back and hip pain.  Pain radiates into her left buttocks down to her toes occasionally.  No numbness or tingling.  Patient has to sit when she has increased pain.  If she walks she has increased pain and sits and rest for 5 to 10 minutes and gets relief.  She has had 3 epidurals done in [place] which helped the back some but still had leg pain with walking or standing.  She had an MRI scan at [facility] but they are closed currently and we cannot see the report.  Patient does not have the disc for review of the images.  Patient is a former smoker.  No associated current bowel or bladder symptoms.  Patient states the scan showed she had some arthritis in her back.  Patient had problems with hypertension hyperlipidemia type 2 diabetes not on insulin.  Chronic back pain previous gallbladder surgery 2017.   Negative for chills or fever.  Review of Systems all other systems are noncontributory to HPI.   Objective: Vital Signs: BP (!) 155/88   Pulse 77   Ht 5\' 2"  (1.575 m)   Wt 165 lb (74.8 kg)   BMI 30.18 kg/m   Physical Exam Constitutional:      Appearance: She is well-developed.  HENT:     Head: Normocephalic.     Right Ear: External ear normal.     Left Ear: External ear normal. There is no impacted cerumen.  Eyes:     Pupils: Pupils are equal, round, and reactive to light.  Neck:     Thyroid: No thyromegaly.     Trachea: No tracheal deviation.  Cardiovascular:     Rate and Rhythm: Normal rate.  Pulmonary:     Effort: Pulmonary effort is normal.  Abdominal:     Palpations: Abdomen is soft.  Musculoskeletal:     Cervical back: No rigidity.  Skin:    General: Skin is warm and dry.  Neurological:     Mental Status: She is alert and oriented to person, place, and time.  Psychiatric:        Behavior: Behavior normal.    Ortho Exam patient has tenderness right sciatic notch minimally and moderate on the left.  Some discomfort straight leg raising 90 degrees on the left.  EHL anterior tib gastrocsoleus is strong knee and ankle jerk are intact negative logroll hips.  Good quad strength right and left.  Hip adductors are strong.  Pedal pulses are palpable.  Specialty Comments:  No specialty comments available.  Imaging: No results found.   PMFS History: Patient Active Problem List   Diagnosis Date Noted   Cholecystitis, acute s/p lap chole 01/12/16 01/13/2016   Acute gangrenous cholecystitis 01/13/2016   Hypokalemia 01/11/2016   AKI (acute kidney injury) (HCC) 01/11/2016   Essential hypertension 01/10/2016   Hyperlipidemia 01/10/2016   Non-insulin dependent type 2 diabetes mellitus (HCC) 01/10/2016   Cholelithiasis 01/10/2016   Biliary colic 01/10/2016   Chronic low back pain 01/10/2016   Past Medical History:  Diagnosis Date   Diabetes mellitus without  complication (HCC)    Hypertension     Family History  Problem Relation Age of Onset   Diabetes type II Other    Hypertension Other     Past Surgical History:  Procedure Laterality Date   CHOLECYSTECTOMY N/A 01/12/2016   Procedure: LAPAROSCOPIC CHOLECYSTECTOMY WITH INTRAOPERATIVE CHOLANGIOGRAM;  Surgeon: Harriette Bouillon, MD;  Location: [facility] OR;  Service: General;  Laterality: N/A;   Social History   Occupational History   Not on file  Tobacco Use   Smoking status: Former    Types: Cigarettes    Quit date: 1974    Years since quitting: 48.6   Smokeless tobacco: Never  Substance and Sexual Activity   Alcohol use: No   Drug use: No   Sexual activity: Not on file

## 2021-03-28 ENCOUNTER — Ambulatory Visit (INDEPENDENT_AMBULATORY_CARE_PROVIDER_SITE_OTHER): Payer: Medicare HMO | Admitting: Orthopaedic Surgery

## 2021-03-28 ENCOUNTER — Other Ambulatory Visit: Payer: Self-pay

## 2021-03-28 DIAGNOSIS — M48061 Spinal stenosis, lumbar region without neurogenic claudication: Secondary | ICD-10-CM | POA: Insufficient documentation

## 2021-03-28 DIAGNOSIS — M48062 Spinal stenosis, lumbar region with neurogenic claudication: Secondary | ICD-10-CM | POA: Diagnosis not present

## 2021-03-28 NOTE — Progress Notes (Signed)
Office Visit Note   Patient: Angel Gonzalez           Date of Birth: June 09, 1946           MRN: [id number] Visit Date: 03/28/2021              Requested by: Medicine, [facility] [address] [place],  [place] [address] PCP: Medicine, [facility]   Assessment & Plan: Visit Diagnoses:  1. Spinal stenosis of lumbar region with neurogenic claudication     Plan: We will have her return in 4 months.  We discussed decompression and instrumented fusion.  She does have some adjacent changes at other levels which could progress but principally would need L3-4 and L4-5 surgically treated with decompression and fusion.  Follow-Up Instructions: Return in about 4 months (around 07/28/2021).   Orders:  No orders of the defined types were placed in this encounter.  No orders of the defined types were placed in this encounter.     Procedures: No procedures performed   Clinical Data: No additional findings.   Subjective: Chief Complaint  Patient presents with   Other     Follow up to review MRI scan    HPI 75 year old female returns post MRI scan done at [facility] and reviewed on disc as well as report.  She has severe stenosis at L3-4 and L4-5.  Some foraminal stenosis at 5 1 but her symptoms of neurogenic claudication which correspond to the two-level stenosis at L3-4 and L4-5.  She has some curvature noted on plain radiographs.  She is able to walk a mile sits for 10 minutes and gets up and walks again.  She is type II diabetic not on insulin.  Review of Systems 14 point system update unchanged.   Objective: Vital Signs: There were no vitals taken for this visit.  Physical Exam Constitutional:      Appearance: She is well-developed.  HENT:     Head: Normocephalic.     Right Ear: External ear normal.     Left Ear: External ear normal. There is no impacted cerumen.  Eyes:     Pupils: Pupils are equal, round, and reactive to light.  Neck:     Thyroid: No  thyromegaly.     Trachea: No tracheal deviation.  Cardiovascular:     Rate and Rhythm: Normal rate.  Pulmonary:     Effort: Pulmonary effort is normal.  Abdominal:     Palpations: Abdomen is soft.  Musculoskeletal:     Cervical back: No rigidity.  Skin:    General: Skin is warm and dry.  Neurological:     Mental Status: She is alert and oriented to person, place, and time.  Psychiatric:        Behavior: Behavior normal.    Ortho Exam patient has more left than right sciatic notch tenderness some pain straight leg raising 90 degrees on the left anterior tib EHL is strong.  Normal ambulation without limp mild lumbar curvature.  Specialty Comments:  No specialty comments available.  Imaging: No results found.   PMFS History: Patient Active Problem List   Diagnosis Date Noted   Spinal stenosis of lumbar region 03/28/2021   Cholecystitis, acute s/p lap chole 01/12/16 01/13/2016   Acute gangrenous cholecystitis 01/13/2016   Hypokalemia 01/11/2016   AKI (acute kidney injury) (HCC) 01/11/2016   Essential hypertension 01/10/2016   Hyperlipidemia 01/10/2016   Non-insulin dependent type 2 diabetes mellitus (HCC) 01/10/2016   Cholelithiasis 01/10/2016  Biliary colic 01/10/2016   Chronic low back pain 01/10/2016   Past Medical History:  Diagnosis Date   Diabetes mellitus without complication (HCC)    Hypertension     Family History  Problem Relation Age of Onset   Diabetes type II Other    Hypertension Other     Past Surgical History:  Procedure Laterality Date   CHOLECYSTECTOMY N/A 01/12/2016   Procedure: LAPAROSCOPIC CHOLECYSTECTOMY WITH INTRAOPERATIVE CHOLANGIOGRAM;  Surgeon: Harriette Bouillon, MD;  Location: [facility] OR;  Service: General;  Laterality: N/A;   Social History   Occupational History   Not on file  Tobacco Use   Smoking status: Former    Types: Cigarettes    Quit date: 1974    Years since quitting: 48.6   Smokeless tobacco: Never  Substance and Sexual  Activity   Alcohol use: No   Drug use: No   Sexual activity: Not on file

## 2021-07-25 ENCOUNTER — Encounter: Payer: Self-pay | Admitting: Orthopaedic Surgery

## 2021-07-25 ENCOUNTER — Other Ambulatory Visit: Payer: Self-pay

## 2021-07-25 ENCOUNTER — Ambulatory Visit (INDEPENDENT_AMBULATORY_CARE_PROVIDER_SITE_OTHER): Payer: Medicare HMO | Admitting: Orthopaedic Surgery

## 2021-07-25 VITALS — Ht 63.0 in | Wt 165.0 lb

## 2021-07-25 DIAGNOSIS — M48062 Spinal stenosis, lumbar region with neurogenic claudication: Secondary | ICD-10-CM

## 2021-07-25 NOTE — Progress Notes (Signed)
Office Visit Note   Patient: Angel Gonzalez           Date of Birth: Nov 01, 1945           MRN: [id number] Visit Date: 07/25/2021              Requested by: Medicine, [facility] [address] [place],  [place] [address] PCP: Medicine, [facility]   Assessment & Plan: Visit Diagnoses:  1. Spinal stenosis of lumbar region with neurogenic claudication     Plan: Office follow-up 3 months.  Follow-Up Instructions: Return in about 3 months (around 10/23/2021).   Orders:  No orders of the defined types were placed in this encounter.  No orders of the defined types were placed in this encounter.     Procedures: No procedures performed   Clinical Data: No additional findings.   Subjective: Chief Complaint  Patient presents with   Lower Back - Pain, Follow-up    HPI 75 year old female 2-month follow-up for low back pain.  She states she has good and bad days.  Pain radiates at times into her feet usually worse on the left than right.  She does have non-insulin-dependent type 2 diabetes.  Previous gallbladder removal hyperlipidemia and hypertension additional medical problems.  Review of Systems from 03/28/2021.  14 point systems updated and unchanged.   Objective: Vital Signs: Ht 5\' 3"  (1.6 m)   Wt 165 lb (74.8 kg)   BMI 29.23 kg/m   Physical Exam Constitutional:      Appearance: She is well-developed.  HENT:     Head: Normocephalic.     Right Ear: External ear normal.     Left Ear: External ear normal. There is no impacted cerumen.  Eyes:     Pupils: Pupils are equal, round, and reactive to light.  Neck:     Thyroid: No thyromegaly.     Trachea: No tracheal deviation.  Cardiovascular:     Rate and Rhythm: Normal rate.  Pulmonary:     Effort: Pulmonary effort is normal.  Abdominal:     Palpations: Abdomen is soft.  Musculoskeletal:     Cervical back: No rigidity.  Skin:    General: Skin is warm and dry.  Neurological:     Mental  Status: She is alert and oriented to person, place, and time.  Psychiatric:        Behavior: Behavior normal.    Ortho Exam patient has sciatic notch tenderness worse on the left than right negative logroll to the hips.  Pain straight leg raising on the left at 90 degrees negative on the right.  Anterior tib EHL gastrocsoleus is strong.  Amatory without limping.  Distal pulses palpable.  Specialty Comments:  No specialty comments available.  Imaging: No results found.   PMFS History: Patient Active Problem List   Diagnosis Date Noted   Spinal stenosis of lumbar region 03/28/2021   Cholecystitis, acute s/p lap chole 01/12/16 01/13/2016   Acute gangrenous cholecystitis 01/13/2016   Hypokalemia 01/11/2016   AKI (acute kidney injury) (HCC) 01/11/2016   Essential hypertension 01/10/2016   Hyperlipidemia 01/10/2016   Non-insulin dependent type 2 diabetes mellitus (HCC) 01/10/2016   Cholelithiasis 01/10/2016   Biliary colic 01/10/2016   Chronic low back pain 01/10/2016   Past Medical History:  Diagnosis Date   Diabetes mellitus without complication (HCC)    Hypertension     Family History  Problem Relation Age of Onset   Diabetes type II Other  Hypertension Other     Past Surgical History:  Procedure Laterality Date   CHOLECYSTECTOMY N/A 01/12/2016   Procedure: LAPAROSCOPIC CHOLECYSTECTOMY WITH INTRAOPERATIVE CHOLANGIOGRAM;  Surgeon: Harriette Bouillon, MD;  Location: [facility] OR;  Service: General;  Laterality: N/A;   Social History   Occupational History   Not on file  Tobacco Use   Smoking status: Former    Types: Cigarettes    Quit date: 1974    Years since quitting: 48.9   Smokeless tobacco: Never  Substance and Sexual Activity   Alcohol use: No   Drug use: No   Sexual activity: Not on file

## 2022-05-06 ENCOUNTER — Telehealth: Payer: Self-pay | Admitting: Emergency Medicine

## 2022-05-06 ENCOUNTER — Encounter: Payer: Self-pay | Admitting: Emergency Medicine

## 2022-05-06 ENCOUNTER — Other Ambulatory Visit: Payer: Self-pay

## 2022-05-06 ENCOUNTER — Ambulatory Visit
Admission: EM | Admit: 2022-05-06 | Discharge: 2022-05-06 | Disposition: A | Discharge status: Home / Self Care | Payer: Medicare HMO | Attending: Family Medicine | Admitting: Family Medicine

## 2022-05-06 DIAGNOSIS — S161XXA Strain of muscle, fascia and tendon at neck level, initial encounter: Secondary | ICD-10-CM

## 2022-05-06 MED ORDER — CYCLOBENZAPRINE HCL 5 MG PO TABS: 5.0000 mg | ORAL_TABLET | Freq: Three times a day (TID) | ORAL | 0 refills | Status: DC | PRN

## 2022-05-06 MED ORDER — NAPROXEN 375 MG PO TABS: 375.0000 mg | ORAL_TABLET | Freq: Every day | ORAL | 0 refills | Status: AC | PRN

## 2022-05-06 MED ORDER — NAPROXEN 375 MG PO TABS: 375.0000 mg | ORAL_TABLET | Freq: Every day | ORAL | 0 refills | Status: DC | PRN

## 2022-05-06 MED ORDER — CYCLOBENZAPRINE HCL 5 MG PO TABS: 5.0000 mg | ORAL_TABLET | Freq: Three times a day (TID) | ORAL | 0 refills | Status: AC | PRN

## 2022-05-06 NOTE — ED Triage Notes (Signed)
Pt reports intermittent bilateral shoulder pain but reports worse on the right. Denies any known injury and reports woke up with pain when discomfort first started. Limited ROM in neck.

## 2022-05-06 NOTE — ED Provider Notes (Signed)
[facility]    CSN: [id number] Arrival date & time: 05/06/22  [id number]      History   Chief Complaint Chief Complaint  Patient presents with   Shoulder Pain    HPI Angel Gonzalez is a 76 y.o. female.   Presenting today with 1 week history of bilateral neck soreness and stiffness radiating down toward shoulders.  States moving her neck makes the pain worse and she is having a lot of pulling pains down the sides of her neck.  She denies midline neck pain, weakness, numbness, tingling down the arms, swelling, injury to the neck, headache, fever.  Tried Tylenol with minimal relief.    Past Medical History:  Diagnosis Date   Diabetes mellitus without complication ([place])    Hypertension     Patient Active Problem List   Diagnosis Date Noted   Spinal stenosis of lumbar region 03/28/2021   Cholecystitis, acute s/p lap chole 01/12/16 01/13/2016   Acute gangrenous cholecystitis 01/13/2016   Hypokalemia 01/11/2016   AKI (acute kidney injury) ([place]) 01/11/2016   Essential hypertension 01/10/2016   Hyperlipidemia 01/10/2016   Non-insulin dependent type 2 diabetes mellitus ([place]) 01/10/2016   Cholelithiasis 0000000   Biliary colic 0000000   Chronic low back pain 01/10/2016    Past Surgical History:  Procedure Laterality Date   CHOLECYSTECTOMY N/A 01/12/2016   Procedure: LAPAROSCOPIC CHOLECYSTECTOMY WITH INTRAOPERATIVE CHOLANGIOGRAM;  Surgeon: Erroll Luna, MD;  Location: [facility] OR;  Service: General;  Laterality: N/A;    OB History   No obstetric history on file.      Home Medications    Prior to Admission medications   Medication Sig Start Date End Date Taking? Authorizing Provider  allopurinol (ZYLOPRIM) 300 MG tablet Take 300 mg by mouth daily.    [provider]  amLODipine (NORVASC) 5 MG tablet Take 5 mg by mouth daily.    [provider]  aspirin EC 81 MG EC tablet Take 1 tablet (81 mg total) by mouth daily. 01/19/16   Mikhail, Velta Addison, DO   canagliflozin (INVOKANA) 100 MG TABS tablet Take 100 mg by mouth daily before breakfast.    [provider]  cefUROXime (CEFTIN) 500 MG tablet Take 1 tablet (500 mg total) by mouth 2 (two) times daily with a meal. 01/19/16   Cristal Ford, DO  cholecalciferol (VITAMIN D) 1000 units tablet Take 1,000 Units by mouth daily.    [provider]  cyclobenzaprine (FLEXERIL) 5 MG tablet Take 1 tablet (5 mg total) by mouth 3 (three) times daily as needed for muscle spasms. Do not drink alcohol or drive while taking this medication.  May cause drowsiness. 05/06/22   Volney American, PA-C  FIBER SELECT GUMMIES PO Take 1 tablet by mouth daily as needed (Constipation).    [provider]  furosemide (LASIX) 20 MG tablet Take 1 tablet (20 mg total) by mouth daily. 01/19/16   Mikhail, Velta Addison, DO  losartan (COZAAR) 100 MG tablet Take 100 mg by mouth daily.    [provider]  magnesium oxide (MAG-OX) 400 MG tablet Take 1 tablet (400 mg total) by mouth daily. 01/19/16   Mikhail, Velta Addison, DO  metoprolol succinate (TOPROL-XL) 25 MG 24 hr tablet Take 25 mg by mouth daily.    [provider]  naproxen (NAPROSYN) 375 MG tablet Take 1 tablet (375 mg total) by mouth daily as needed. 05/06/22   Volney American, PA-C  ondansetron (ZOFRAN) 8 MG tablet Take 1 tablet (8 mg total)  by mouth every 4 (four) hours as needed. 01/06/16   Nat Christen, MD  pravastatin (PRAVACHOL) 20 MG tablet Take 20 mg by mouth daily.    [provider]    Family History Family History  Problem Relation Age of Onset   Diabetes type II Other    Hypertension Other     Social History Social History   Tobacco Use   Smoking status: Former    Types: Cigarettes    Quit date: 1974    Years since quitting: 49.7   Smokeless tobacco: Never  Substance Use Topics   Alcohol use: No   Drug use: No     Allergies   Penicillins   Review of Systems Review of Systems Per  HPI  Physical Exam Triage Vital Signs ED Triage Vitals  Enc Vitals Group     BP 05/06/22 0855 (!) 140/80     Pulse Rate 05/06/22 0855 81     Resp 05/06/22 0855 20     Temp 05/06/22 0855 97.7 F (36.5 C)     Temp Source 05/06/22 0855 Oral     SpO2 05/06/22 0855 97 %     Weight --      Height --      Head Circumference --      Peak Flow --      Pain Score 05/06/22 0856 0     Pain Loc --      Pain Edu? --      Excl. in [place]? --    No data found.  Updated Vital Signs BP (!) 140/80 (BP Location: Right Arm)   Pulse 81   Temp 97.7 F (36.5 C) (Oral)   Resp 20   SpO2 97%   Visual Acuity Right Eye Distance:   Left Eye Distance:   Bilateral Distance:    Right Eye Near:   Left Eye Near:    Bilateral Near:     Physical Exam Vitals and nursing note reviewed.  Constitutional:      Appearance: Normal appearance. She is not ill-appearing.  HENT:     Head: Atraumatic.  Eyes:     Extraocular Movements: Extraocular movements intact.     Conjunctiva/sclera: Conjunctivae normal.  Cardiovascular:     Rate and Rhythm: Normal rate and regular rhythm.     Heart sounds: Normal heart sounds.  Pulmonary:     Effort: Pulmonary effort is normal.     Breath sounds: Normal breath sounds.  Musculoskeletal:        General: Tenderness present. No swelling, deformity or signs of injury. Normal range of motion.     Cervical back: Normal range of motion and neck supple.     Comments: No midline spinal ttp diffusely, ROM intact at c spine, b/l SCM and trapezius ttp and mild spasm  Skin:    General: Skin is warm and dry.  Neurological:     Mental Status: She is alert and oriented to person, place, and time.     Motor: No weakness.     Gait: Gait normal.     Comments: Bilateral upper extremities neurovascularly intact  Psychiatric:        Mood and Affect: Mood normal.        Thought Content: Thought content normal.        Judgment: Judgment normal.    UC Treatments / Results   Labs (all labs ordered are listed, but only abnormal results are displayed) Labs Reviewed - No data to display  EKG   Radiology No results found.  Procedures Procedures (including critical care time)  Medications Ordered in UC Medications - No data to display  Initial Impression / Assessment and Plan / UC Course  I have reviewed the triage vital signs and the nursing notes.  Pertinent labs & imaging results that were available during my care of the patient were reviewed by me and considered in my medical decision making (see chart for details).     Will forgo x-ray today with shared decision making as pain is with movement and tenderness is evident within muscle groups. Treat with flexeril, rare NSAID use given HTN, tylenol, heat, massage, supportive pillow. Return for worsening sxs.   Final Clinical Impressions(s) / UC Diagnoses   Final diagnoses:  Strain of neck muscle, initial encounter   Discharge Instructions   None    ED Prescriptions     Medication Sig Dispense Auth. Provider   cyclobenzaprine (FLEXERIL) 5 MG tablet  (Status: Discontinued) Take 1 tablet (5 mg total) by mouth 3 (three) times daily as needed for muscle spasms. Do not drink alcohol or drive while taking this medication.  May cause drowsiness. 15 tablet Volney American, [place]   naproxen (NAPROSYN) 375 MG tablet  (Status: Discontinued) Take 1 tablet (375 mg total) by mouth daily as needed. 10 tablet Volney American, [place]   naproxen (NAPROSYN) 375 MG tablet Take 1 tablet (375 mg total) by mouth daily as needed. 10 tablet Volney American, PA-C   cyclobenzaprine (FLEXERIL) 5 MG tablet Take 1 tablet (5 mg total) by mouth 3 (three) times daily as needed for muscle spasms. Do not drink alcohol or drive while taking this medication.  May cause drowsiness. 15 tablet Volney American, [place]      PDMP not reviewed this encounter.   Merrie Roof [place], [place] 05/06/22 [phone]
